# Patient Record
Sex: Male | Born: 1970
Health system: Southern US, Community
[De-identification: ages and names within clinical notes are randomized; demographics above are authoritative.]

## PROBLEM LIST (undated history)

## (undated) DIAGNOSIS — I82629 Acute embolism and thrombosis of deep veins of unspecified upper extremity: Secondary | ICD-10-CM

## (undated) HISTORY — PX: SURGERY SCROTAL / TESTICULAR: SUR1316

## (undated) HISTORY — DX: Acute embolism and thrombosis of deep veins of unspecified upper extremity: I82.629

## (undated) HISTORY — PX: KNEE SURGERY: SHX244

---

## 2010-01-04 ENCOUNTER — Ambulatory Visit: Payer: Self-pay | Admitting: Diagnostic Radiology

## 2010-01-04 ENCOUNTER — Encounter: Payer: Self-pay | Admitting: Internal Medicine

## 2010-01-04 ENCOUNTER — Emergency Department (HOSPITAL_BASED_OUTPATIENT_CLINIC_OR_DEPARTMENT_OTHER): Admission: EM | Admit: 2010-01-04 | Discharge: 2010-01-05 | Payer: Self-pay | Admitting: Emergency Medicine

## 2010-01-13 ENCOUNTER — Ambulatory Visit: Payer: Self-pay | Admitting: Internal Medicine

## 2010-01-13 DIAGNOSIS — R142 Eructation: Secondary | ICD-10-CM

## 2010-01-13 DIAGNOSIS — R141 Gas pain: Secondary | ICD-10-CM

## 2010-01-13 DIAGNOSIS — K602 Anal fissure, unspecified: Secondary | ICD-10-CM | POA: Insufficient documentation

## 2010-01-13 DIAGNOSIS — R143 Flatulence: Secondary | ICD-10-CM

## 2010-02-26 ENCOUNTER — Ambulatory Visit: Payer: Self-pay | Admitting: Internal Medicine

## 2010-04-13 NOTE — Assessment & Plan Note (Signed)
Summary: RECTAL PAIN AND BLEEDING- NOTES E CHART...AS.   History of Present Illness Visit Type: Initial Visit Primary GI MD: Stan Head MD [facility] Primary Tinsleigh Slovacek: n/a Requesting Lavona Norsworthy: N/A Chief Complaint: Pt thought he had hemorrhoids causing the rectal bleeding but was checked at [facility] and tested NEG for hemorrhoids History of Present Illness:   About 2 weeks ago he noted pain in the rectal area Seen in ED, negative rectal for hemorrhoids with normal CBC. Note removed. He thinks the pain began after defecation. He describes typical large bowel movenemnts. Intermittent red blood per rectum, bright. Streaks and then some more. Painful to defecate with sharp and cutting pain.Also with pruritis ani. Bowel movements are not hard. Did not get stool softeners as recommended and did not fill narcotic from ED.  Chronic blaoting and frequent gas. High fiber diet claimed. Rare heartburn.   GI Review of Systems    Reports acid reflux, bloating, chest pain, and  heartburn.      Denies abdominal pain, belching, dysphagia with liquids, dysphagia with solids, loss of appetite, nausea, vomiting, vomiting blood, weight loss, and  weight gain.      Reports rectal bleeding and  rectal pain.     Denies anal fissure, black tarry stools, change in bowel habit, constipation, diarrhea, diverticulosis, fecal incontinence, heme positive stool, hemorrhoids, irritable bowel syndrome, jaundice, light color stool, and  liver problems. Preventive Screening-Counseling & Management  Alcohol-Tobacco     Smoking Status: never  Caffeine-Diet-Exercise     Does Patient Exercise: yes      Drug Use:  no.      Current Medications (verified): 1)  None  Allergies (verified): No Known Drug Allergies  Past History:  Past Medical History: Asthma (Child)  Past Surgical History: Reviewed history from 01/12/2010 and no changes required. Knee Surgery  Family History: No FH of Colon  Cancer: Family History of Prostate Cancer: uncle  Social History: Patient has never smoked.  Alcohol Use - yes  occasional No Caffeine Illicit Drug Use - no Patient gets regular exercise. Smoking Status:  never Drug Use:  no Does Patient Exercise:  yes  Review of Systems       The patient complains of allergy/sinus and back pain.         All other ROS negative except as per HPI.   Vital Signs:  Patient profile:   40 year old male Height:      75 inches Weight:      259 pounds BMI:     32.49 BSA:     2.45 Pulse rate:   76 / minute Pulse rhythm:   regular BP sitting:   124 / 84  (left arm)  Vitals Entered By: Merri Ray CMA Duncan Dull) (January 13, 2010 8:53 AM)  Physical Exam  General:  Well developed, well nourished, no acute distress. Eyes:  PERRLA, no icterus. Mouth:  No deformity or lesions, dentition normal. Neck:  Supple; no masses or thyromegaly. Lungs:  Clear throughout to auscultation. Heart:  Regular rate and rhythm; no murmurs, rubs,  or bruits. Abdomen:  Soft, nontender and nondistended. No masses, hepatosplenomegaly or hernias noted. Normal bowel sounds. Rectal:  inspection - normal anoderm except for anterior sentinel pile tender with 5th digit partially inserted, especially anterior no masses/fluctuance Extremities:  No clubbing, cyanosis, edema or deformities noted. Neurologic:  Alert and  oriented x4;  Cervical Nodes:  No significant cervical or supraclavicular adenopathy.  Psych:  Alert and cooperative. Normal mood  and affect.  Procedure: CBC WITH DIFF Result: WBC COUNT 5.0 K/uL [4.0-10.5] RBC COUNT 5.59 MIL/uL [4.22-5.81] HEMOGLOBIN 15.6 g/dL [04.5-40.9] HEMATOCRIT 47.4 % [39.0-52.0] MCV 84.7 fL [78.0-100.0] MCH 27.9 pg [26.0-34.0] MCHC 32.9 g/dL [81.1-91.4] RDW 78.2 % [11.5-15.5] PLATELET COUNT 209 K/uL [150-400] NEUTROPHIL 34 % [43-77] L ABS GRANULOCYTE 1.7 K/uL [1.7-7.7] LYMPHOCYTE 51 % [12-46] H ABS LYMPH 2.5 K/uL  [0.7-4.0] MONOCYTE 8 % [3-12] ABS MONOCYTE 0.4 K/uL [0.1-1.0] EOSINOPHIL 6 % [0-5] H ABS EOS 0.3 K/uL [0.0-0.7] BASOPHIL 1 % [0-1] ABS BASO 0.1 K/uL [0.0-0.1] 23:51 01/04/2010 by April Palumbo-Rasch - MD, MD  Impression & Recommendations:  Problem # 1:  ANAL FISSURE (ICD-565.0) Assessment [place] and exam are classic for this. Stool softeners, sitz baths, anal care and diltiazem gel ar ethe plan. reassess at 6 weeks. Could need a surgical referral if fails to resolve. I explained nature of the problem to patient and wife.  Problem # 2:  FLATULENCE ERUCTATION AND GAS PAIN (ICD-787.3) Assessment: [place] functional and diet related. Try Align probiotic, reduce fber. May need to reduce carbohydrates also.  Patient Instructions: 1)  Please pick up your medications at your pharmacy. Diltiazem gel was prescribed to treat anal fissure. 2)  Take a stool softener 1-2 times a day. 3)  Soak in a tub and use a hair dryer and wet wipes for anal care as discussed. 4)  If itching remains a problem find an ointment or cream with pramoxine in it (over the counter). 5)  Take 1 Align capsule daily for 1 month. If it helped you may take it 1 month at a time as needed in a pulse fashion, or you may find you need it every day, chronically. It is available over the counter. 6)  Please schedule a follow-up appointment in 6  weeks.  7)  Anal Fissure, Abcess and fistula brochure given. Gas reduction diet given. 8)  The medication list was reviewed and reconciled.  All changed / newly prescribed medications were explained.  A complete medication list was provided to the patient / caregiver. Prescriptions: DILTIAZEM 2% GEL apply small amount to anal area 2-3 times a day and after bowel movements.  #30 grams x 1   Entered and Authorized by:   Iva Boop MD, [facility]   Signed by:   Iva Boop MD, [facility] on 01/13/2010   Method used:   Printed then faxed to ...       [facility]* (retail)        [address]       [place], [place]  [id number]       Ph: [phone]       Fax: [phone]   RxID:   [id number]

## 2010-05-26 LAB — CBC
HCT: 47.4 % (ref 39.0–52.0)
RDW: 12.4 % (ref 11.5–15.5)
WBC: 5 10*3/uL (ref 4.0–10.5)

## 2010-05-26 LAB — DIFFERENTIAL
Basophils Absolute: 0.1 10*3/uL (ref 0.0–0.1)
Lymphocytes Relative: 51 % — ABNORMAL HIGH (ref 12–46)
Monocytes Absolute: 0.4 10*3/uL (ref 0.1–1.0)
Neutro Abs: 1.7 10*3/uL (ref 1.7–7.7)

## 2010-05-26 LAB — BASIC METABOLIC PANEL
BUN: 17 mg/dL (ref 6–23)
GFR calc Af Amer: >60 mL/min (ref >60)
GFR calc non Af Amer: 56 mL/min — ABNORMAL LOW (ref >60)
Potassium: 5 mEq/L (ref 3.5–5.1)
Sodium: 143 mEq/L (ref 135–145)

## 2010-05-26 LAB — HEMOCCULT GUIAC POC 1CARD (OFFICE): Fecal Occult Bld: NEGATIVE

## 2011-04-04 ENCOUNTER — Emergency Department (INDEPENDENT_AMBULATORY_CARE_PROVIDER_SITE_OTHER): Payer: Self-pay

## 2011-04-04 ENCOUNTER — Emergency Department (HOSPITAL_BASED_OUTPATIENT_CLINIC_OR_DEPARTMENT_OTHER)
Admission: EM | Admit: 2011-04-04 | Discharge: 2011-04-04 | Disposition: A | Discharge status: Home / Self Care | Payer: Self-pay | Attending: Emergency Medicine | Admitting: Emergency Medicine

## 2011-04-04 ENCOUNTER — Encounter (HOSPITAL_BASED_OUTPATIENT_CLINIC_OR_DEPARTMENT_OTHER): Payer: Self-pay

## 2011-04-04 DIAGNOSIS — Z79899 Other long term (current) drug therapy: Secondary | ICD-10-CM | POA: Insufficient documentation

## 2011-04-04 DIAGNOSIS — J45909 Unspecified asthma, uncomplicated: Secondary | ICD-10-CM | POA: Insufficient documentation

## 2011-04-04 DIAGNOSIS — R062 Wheezing: Secondary | ICD-10-CM

## 2011-04-04 DIAGNOSIS — R05 Cough: Secondary | ICD-10-CM

## 2011-04-04 DIAGNOSIS — R5383 Other fatigue: Secondary | ICD-10-CM

## 2011-04-04 DIAGNOSIS — J40 Bronchitis, not specified as acute or chronic: Secondary | ICD-10-CM | POA: Insufficient documentation

## 2011-04-04 MED ORDER — IPRATROPIUM BROMIDE 0.02 % IN SOLN
0.5000 mg | Freq: Once | RESPIRATORY_TRACT | Status: AC
  Administered 2011-04-04: 0.5 mg via RESPIRATORY_TRACT
  Filled 2011-04-04: qty 2.5

## 2011-04-04 MED ORDER — PREDNISONE 50 MG PO TABS
60.0000 mg | ORAL_TABLET | Freq: Once | ORAL | Status: AC
  Administered 2011-04-04: 60 mg via ORAL
  Filled 2011-04-04: qty 1

## 2011-04-04 MED ORDER — GUAIFENESIN-CODEINE 100-10 MG/5ML PO SYRP: 5.0000 mL | ORAL_SOLUTION | Freq: Three times a day (TID) | ORAL | Status: AC | PRN

## 2011-04-04 MED ORDER — DOXYCYCLINE HYCLATE 100 MG PO CAPS: 100.0000 mg | ORAL_CAPSULE | Freq: Two times a day (BID) | ORAL | Status: AC

## 2011-04-04 MED ORDER — ALBUTEROL SULFATE HFA 108 (90 BASE) MCG/ACT IN AERS: 1.0000 | INHALATION_SPRAY | Freq: Four times a day (QID) | RESPIRATORY_TRACT | Status: DC | PRN

## 2011-04-04 MED ORDER — PREDNISONE 50 MG PO TABS: ORAL_TABLET | ORAL | Status: AC

## 2011-04-04 MED ORDER — ALBUTEROL SULFATE (5 MG/ML) 0.5% IN NEBU
5.0000 mg | INHALATION_SOLUTION | Freq: Once | RESPIRATORY_TRACT | Status: AC
  Administered 2011-04-04: 5 mg via RESPIRATORY_TRACT
  Filled 2011-04-04: qty 1

## 2011-04-04 NOTE — ED Notes (Signed)
Heart rate 97 with ambulation

## 2011-04-04 NOTE — ED Notes (Signed)
Pt states that he was dx with flu about 2 weeks ago and symptoms went away but states that he still has a cough which will not ago away.  Wheezing persists.  Hx of asthma

## 2011-04-04 NOTE — ED Provider Notes (Signed)
History   This chart was scribed for Glynn Octave, MD by Melba Coon. The patient was seen in room MH03/MH03 and the patient's care was started at 8:45PM.    CSN: [id number]  Arrival date & time 04/04/11  2005   First MD Initiated Contact with Patient 04/04/11 2032      Chief Complaint  Patient presents with  . Cough    (Consider location/radiation/quality/duration/timing/severity/associated sxs/prior treatment) HPI Roy Hill is a 41 y.o. male who presents to the Emergency Department complaining of persistent, moderate to severe productive cough with an onset 2 weeks ago. Pt had flu-like symptoms 2 weeks ago (fever, nausea, cough etc.). Since then, the other symptoms have gone away but the cough continues to be present. Pt initially took Nyquil which did not alleviate his cough; laying down at night aggravates his cough. Pt went to [facility] last week for cough; pt was given steroid shot at hospital, sent home with cough meds and breathing treatment, and told to f/u w/ PCP after 4 days. Pt does not have a regular PCP. Cough started to get worse which is way he presented to the ED today. CP pain present when pt coughs (hurt in ribs), wheezing also present. No n/v/d/, rhinorrhea, sore throat, stomach pain, problems swallowing. No other medical problems. + Hx of asthma.  No hospitalizations since childhood.   Past Medical History  Diagnosis Date  . Asthma     Past Surgical History  Procedure Date  . Knee surgery   . Surgery scrotal / testicular     History reviewed. No pertinent family history.  History  Substance Use Topics  . Smoking status: Never Smoker   . Smokeless tobacco: Never Used  . Alcohol Use: Yes     occasionally      Review of Systems 10 Systems reviewed and are negative for acute change except as noted in the HPI.  Allergies  Review of patient's allergies indicates no known allergies.  Home Medications   Current Outpatient Rx    Name Route Sig Dispense Refill  . HYDROCODONE-HOMATROPINE 5-1.5 MG/5ML PO SYRP Oral Take 5 mLs by mouth every 4 (four) hours as needed. For cough    . ADULT MULTIVITAMIN W/MINERALS CH Oral Take 1 tablet by mouth daily.    Marland Kitchen PSEUDOEPH-DOXYLAMINE-DM-APAP 60-7.08-10-998 MG/30ML PO LIQD Oral Take 30 mLs by mouth at bedtime as needed. For cough and congestion    . ALBUTEROL SULFATE HFA 108 (90 BASE) MCG/ACT IN AERS Inhalation Inhale 1-2 puffs into the lungs every 6 (six) hours as needed for wheezing. 1 Inhaler 0  . DOXYCYCLINE HYCLATE 100 MG PO CAPS Oral Take 1 capsule (100 mg total) by mouth 2 (two) times daily. 20 capsule 0  . GUAIFENESIN-CODEINE 100-10 MG/5ML PO SYRP Oral Take 5 mLs by mouth 3 (three) times daily as needed for cough. 120 mL 0  . PREDNISONE 50 MG PO TABS  1 tablet PO daily 5 tablet 0    BP 153/112  Pulse 97  Temp(Src) 98.4 F (36.9 C) (Oral)  Resp 22  Ht 6\' 3"  (1.905 m)  Wt 260 lb (117.935 kg)  BMI 32.50 kg/m2  SpO2 97%  Physical Exam  Constitutional: He is oriented to person, place, and time. He appears well-developed and well-nourished.  HENT:  Head: Normocephalic and atraumatic.  Right Ear: External ear normal.  Left Ear: External ear normal.  Eyes: Conjunctivae and EOM are normal. Pupils are equal, round, and reactive to light. No scleral icterus.  Neck: Normal range of motion. Neck supple. No thyromegaly present.  Cardiovascular: Normal rate, regular rhythm and normal heart sounds.  Exam reveals no gallop and no friction rub.   No murmur heard. Pulmonary/Chest: Effort normal. No stridor. He has wheezes (Scattered). He has no rales. He exhibits no tenderness.       Coarse breath sounds  Abdominal: Soft. He exhibits no distension. There is no tenderness. There is no rebound.  Musculoskeletal: Normal range of motion. He exhibits no edema.  Lymphadenopathy:    He has no cervical adenopathy.  Neurological: He is alert and oriented to person, place, and time.  Coordination normal.  Skin: Skin is warm. No rash noted. No erythema.  Psychiatric: He has a normal mood and affect. His behavior is normal. Judgment and thought content normal.    ED Course  Procedures (including critical care time)  DIAGNOSTIC STUDIES: Oxygen Saturation is 98% on room air, normal by my interpretation.    COORDINATION OF CARE:  8:49PM - EDMD consults with pt about possible ways to alleviate cough symptoms (i.e. keep away from strong scents; don't be around sick ppl; don't be around smokers; humidifier at home, etc.)   9:26PM - plans for d/c; pt agrees   Labs Reviewed - No data to display Dg Chest 2 View  04/04/2011  *RADIOLOGY REPORT*  Clinical Data: Cough, wheezing and weakness.  CHEST - 2 VIEW  Comparison: Single view of the chest 01/04/2010.  Findings: Lung volumes are low with some mild basilar atelectasis. No consolidative process, pneumothorax or effusion.  Heart size normal.  IMPRESSION: No acute finding.  Original Report Authenticated By: Bernadene Bell. D'ALESSIO, M.D.     1. Bronchitis       MDM  History of asthma with cough and wheezing for the past 2 weeks. She was seen in the [facility] ER 4 days ago and given a cough medicine. No fevers, chest pain.  No desaturation with ambulation.  No distress. Treat for bronchitis with antibiotics and steroids.   I personally performed the services described in this documentation, which was scribed in my presence.  The recorded information has been reviewed and considered.       Glynn Octave, MD 04/05/11 [phone]

## 2011-04-04 NOTE — ED Notes (Signed)
Pt has an hx of asthma but has had the flu but still has the cough for the past 2 weeks. Pt was seen at [facility] and was given cough RX and an Albuterol MDI but symptoms are the same.

## 2011-11-23 ENCOUNTER — Encounter (HOSPITAL_BASED_OUTPATIENT_CLINIC_OR_DEPARTMENT_OTHER): Payer: Self-pay | Admitting: *Deleted

## 2011-11-23 ENCOUNTER — Emergency Department (HOSPITAL_BASED_OUTPATIENT_CLINIC_OR_DEPARTMENT_OTHER)
Admission: EM | Admit: 2011-11-23 | Discharge: 2011-11-23 | Disposition: A | Discharge status: Home / Self Care | Payer: BC Managed Care – PPO | Attending: Emergency Medicine | Admitting: Emergency Medicine

## 2011-11-23 DIAGNOSIS — K529 Noninfective gastroenteritis and colitis, unspecified: Secondary | ICD-10-CM

## 2011-11-23 DIAGNOSIS — R197 Diarrhea, unspecified: Secondary | ICD-10-CM | POA: Insufficient documentation

## 2011-11-23 DIAGNOSIS — R1110 Vomiting, unspecified: Secondary | ICD-10-CM | POA: Insufficient documentation

## 2011-11-23 DIAGNOSIS — J45909 Unspecified asthma, uncomplicated: Secondary | ICD-10-CM | POA: Insufficient documentation

## 2011-11-23 LAB — CBC WITH DIFFERENTIAL/PLATELET
Basophils Absolute: 0 10*3/uL (ref 0.0–0.1)
Basophils Relative: 1 % (ref 0–1)
Eosinophils Absolute: 0.2 10*3/uL (ref 0.0–0.7)
Eosinophils Relative: 5 % (ref 0–5)
HCT: 43.3 % (ref 39.0–52.0)
Hemoglobin: 14.6 g/dL (ref 13.0–17.0)
Lymphocytes Relative: 51 % — ABNORMAL HIGH (ref 12–46)
Lymphs Abs: 2.3 10*3/uL (ref 0.7–4.0)
MCH: 26.4 pg (ref 26.0–34.0)
MCHC: 33.7 g/dL (ref 30.0–36.0)
MCV: 78.4 fL (ref 78.0–100.0)
Monocytes Absolute: 0.5 10*3/uL (ref 0.1–1.0)
Monocytes Relative: 10 % (ref 3–12)
Neutro Abs: 1.5 10*3/uL — ABNORMAL LOW (ref 1.7–7.7)
Neutrophils Relative %: 33 % — ABNORMAL LOW (ref 43–77)
Platelets: 223 10*3/uL (ref 150–400)
RBC: 5.52 MIL/uL (ref 4.22–5.81)
RDW: 13.5 % (ref 11.5–15.5)
WBC: 4.5 10*3/uL (ref 4.0–10.5)

## 2011-11-23 LAB — URINALYSIS, ROUTINE W REFLEX MICROSCOPIC
Bilirubin Urine: NEGATIVE
Glucose, UA: NEGATIVE mg/dL
Hgb urine dipstick: NEGATIVE
Ketones, ur: NEGATIVE mg/dL
Leukocytes, UA: NEGATIVE
Nitrite: NEGATIVE
Protein, ur: NEGATIVE mg/dL
Specific Gravity, Urine: 1.016 (ref 1.005–1.030)
Urobilinogen, UA: 0.2 mg/dL (ref 0.0–1.0)
pH: 6 (ref 5.0–8.0)

## 2011-11-23 LAB — COMPREHENSIVE METABOLIC PANEL
ALT: 31 U/L (ref 0–53)
AST: 23 U/L (ref 0–37)
Albumin: 4 g/dL (ref 3.5–5.2)
Alkaline Phosphatase: 98 U/L (ref 39–117)
BUN: 14 mg/dL (ref 6–23)
CO2: 26 mEq/L (ref 19–32)
Calcium: 9.6 mg/dL (ref 8.4–10.5)
Chloride: 102 mEq/L (ref 96–112)
Creatinine, Ser: 1.2 mg/dL (ref 0.50–1.35)
GFR calc Af Amer: 85 mL/min — ABNORMAL LOW (ref >90)
GFR calc non Af Amer: 73 mL/min — ABNORMAL LOW (ref >90)
Glucose, Bld: 73 mg/dL (ref 70–99)
Potassium: 4.3 mEq/L (ref 3.5–5.1)
Sodium: 138 mEq/L (ref 135–145)
Total Bilirubin: 0.5 mg/dL (ref 0.3–1.2)
Total Protein: 7.6 g/dL (ref 6.0–8.3)

## 2011-11-23 MED ORDER — PROMETHAZINE HCL 25 MG PO TABS: 25.0000 mg | ORAL_TABLET | Freq: Four times a day (QID) | ORAL | Status: DC | PRN

## 2011-11-23 MED ORDER — SODIUM CHLORIDE 0.9 % IV BOLUS (SEPSIS)
2000.0000 mL | Freq: Once | INTRAVENOUS | Status: AC
  Administered 2011-11-23: 2000 mL via INTRAVENOUS

## 2011-11-23 MED ORDER — LOPERAMIDE HCL 2 MG PO CAPS: 2.0000 mg | ORAL_CAPSULE | Freq: Four times a day (QID) | ORAL | Status: AC | PRN

## 2011-11-23 MED ORDER — ONDANSETRON HCL 4 MG/2ML IJ SOLN
4.0000 mg | Freq: Once | INTRAMUSCULAR | Status: AC
  Administered 2011-11-23: 4 mg via INTRAVENOUS
  Filled 2011-11-23: qty 2

## 2011-11-23 NOTE — ED Provider Notes (Signed)
Medical screening examination/treatment/procedure(s) were performed by non-physician practitioner and as supervising physician I was immediately available for consultation/collaboration.   Joya Gaskins, MD 11/23/11 [phone]

## 2011-11-23 NOTE — ED Notes (Signed)
Pt amb to triage with quick steady gait in nad. Pt reports sudden onset of n/v/d and cramping x Monday. Pt states he is unable to retain any po. Denies any abd pain or fevers.

## 2011-11-23 NOTE — ED Provider Notes (Signed)
History     CSN: [id number]  Arrival date & time 11/23/11  1601   First MD Initiated Contact with Patient 11/23/11 1613      Chief Complaint  Patient presents with  . Emesis  . Diarrhea    (Consider location/radiation/quality/duration/timing/severity/associated sxs/prior treatment) HPI  The patient presents with nausea, vomiting, and diarrhea.  These symptoms began late Monday night/early Tuesday morning. The patient has only been able to consume sips of ginger ale and a few crackers. The patient denies fever, chills, headache, vision changes, nasal congestion, cough, sore throat, hematemesis, chest pain, SOB, abdominal pain, hematochezia, and urinary symptoms.  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  Past Medical History  Diagnosis Date  . Asthma     Past Surgical History  Procedure Date  . Knee surgery   . Surgery scrotal / testicular     History reviewed. No pertinent family history.  History  Substance Use Topics  . Smoking status: Never Smoker   . Smokeless tobacco: Never Used  . Alcohol Use: Yes     occasionally      Review of Systems All other systems negative except as documented in the HPI. All pertinent positives and negatives as reviewed in the HPI.  Allergies  Review of patient's allergies indicates no known allergies.  Home Medications   Current Outpatient Rx  Name Route Sig Dispense Refill  . ALBUTEROL SULFATE HFA 108 (90 BASE) MCG/ACT IN AERS Inhalation Inhale 1-2 puffs into the lungs every 6 (six) hours as needed for wheezing. 1 Inhaler 0  . HYDROCODONE-HOMATROPINE 5-1.5 MG/5ML PO SYRP Oral Take 5 mLs by mouth every 4 (four) hours as needed. For cough    . ADULT MULTIVITAMIN W/MINERALS CH Oral Take 1 tablet by mouth daily.    Marland Kitchen PSEUDOEPH-DOXYLAMINE-DM-APAP 60-7.08-10-998 MG/30ML PO LIQD Oral Take 30 mLs by mouth at bedtime as needed. For cough and congestion      BP 127/80  Pulse 70  Temp  98.3 F (36.8 C) (Oral)  Resp 18  Ht 6\' 3"  (1.905 m)  Wt 250 lb (113.399 kg)  BMI 31.25 kg/m2  SpO2 100%  Physical Exam  Constitutional: He is oriented to person, place, and time. He appears well-developed and well-nourished. No distress.  HENT:  Head: Normocephalic and atraumatic.  Right Ear: External ear normal.  Left Ear: External ear normal.  Nose: Nose normal.  Mouth/Throat: Oropharynx is clear and moist. No oropharyngeal exudate.  Eyes: Conjunctivae normal and EOM are normal. Pupils are equal, round, and reactive to light. Right eye exhibits no discharge. Left eye exhibits no discharge.  Neck: Normal range of motion. Neck supple. No thyromegaly present.  Cardiovascular: Normal rate, regular rhythm and normal heart sounds.   No murmur heard. Pulmonary/Chest: Breath sounds normal. No respiratory distress. He has no wheezes. He has no rales. He exhibits no tenderness.  Abdominal: Soft. Bowel sounds are normal. He exhibits no distension and no mass. There is no tenderness. There is no rigidity and no guarding.  Musculoskeletal: Normal range of motion. He exhibits no edema and no tenderness.  Lymphadenopathy:    He has no cervical adenopathy.  Neurological: He is alert and oriented to person, place, and time.  Skin: Skin is warm and dry. No rash noted. He is not diaphoretic. No erythema.  Psychiatric: He has a normal mood and affect. His behavior is normal. Judgment and thought content normal.    ED Course  Procedures (including  critical care time)   Labs Reviewed  CBC WITH DIFFERENTIAL  COMPREHENSIVE METABOLIC PANEL  URINALYSIS, ROUTINE W REFLEX MICROSCOPIC   No results found.   No diagnosis found.  Patient assessed and vitals stable. CBC, CMP, and UA to assess for infection and electrolyte abnormalities.  IV fluids given for hydration.  Zofran for symptomatic treatment.    6:00 pm - rechecked patient.  He just started his second liter of IV Fluids.   Feels the same in  terms of presenting symptoms except he has minor headache.  Has not had a bowel movement here, but needs to urinate so we will obtain urinalysis.  I reviewed CBC and CMP results with the patient.  He had no current questions or concerns.  7:01pm- patient is feeling better. The patient was able to tolerate ginger ale here. This is most likely gastroenteritis based on his HPI and PE,. Patient is advised to return here as needed. MDM  MDM Reviewed: nursing note and vitals Interpretation: labs           Carlyle Dolly, PA-C 11/23/11 1903  Carlyle Dolly, PA-C 11/23/11 1904

## 2012-03-30 ENCOUNTER — Encounter (HOSPITAL_BASED_OUTPATIENT_CLINIC_OR_DEPARTMENT_OTHER): Payer: Self-pay | Admitting: Emergency Medicine

## 2012-03-30 ENCOUNTER — Emergency Department (HOSPITAL_BASED_OUTPATIENT_CLINIC_OR_DEPARTMENT_OTHER)
Admission: EM | Admit: 2012-03-30 | Discharge: 2012-03-30 | Disposition: A | Discharge status: Home / Self Care | Payer: BC Managed Care – PPO | Attending: Emergency Medicine | Admitting: Emergency Medicine

## 2012-03-30 DIAGNOSIS — M79609 Pain in unspecified limb: Secondary | ICD-10-CM | POA: Insufficient documentation

## 2012-03-30 DIAGNOSIS — J45909 Unspecified asthma, uncomplicated: Secondary | ICD-10-CM | POA: Insufficient documentation

## 2012-03-30 DIAGNOSIS — M79673 Pain in unspecified foot: Secondary | ICD-10-CM

## 2012-03-30 DIAGNOSIS — Z87828 Personal history of other (healed) physical injury and trauma: Secondary | ICD-10-CM | POA: Insufficient documentation

## 2012-03-30 MED ORDER — HYDROCODONE-ACETAMINOPHEN 5-325 MG PO TABS: 2.0000 | ORAL_TABLET | ORAL | Status: DC | PRN
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
## 2012-03-30 NOTE — ED Notes (Signed)
Patient refused crutches, states he has them at home

## 2012-03-30 NOTE — ED Notes (Signed)
Patient reports that he injured his foot in October of 2013 and it is hurting worse today. Patient is ambulating WNL briskly in tennis shoes. Reports increased pain and swelling today.

## 2012-04-11 ENCOUNTER — Emergency Department (HOSPITAL_BASED_OUTPATIENT_CLINIC_OR_DEPARTMENT_OTHER)
Admission: EM | Admit: 2012-04-11 | Discharge: 2012-04-11 | Disposition: A | Discharge status: Home / Self Care | Payer: BC Managed Care – PPO | Attending: Emergency Medicine | Admitting: Emergency Medicine

## 2012-04-11 ENCOUNTER — Encounter (HOSPITAL_BASED_OUTPATIENT_CLINIC_OR_DEPARTMENT_OTHER): Payer: Self-pay | Admitting: *Deleted

## 2012-04-11 DIAGNOSIS — Z87828 Personal history of other (healed) physical injury and trauma: Secondary | ICD-10-CM | POA: Insufficient documentation

## 2012-04-11 DIAGNOSIS — M79673 Pain in unspecified foot: Secondary | ICD-10-CM

## 2012-04-11 DIAGNOSIS — Z79899 Other long term (current) drug therapy: Secondary | ICD-10-CM | POA: Insufficient documentation

## 2012-04-11 DIAGNOSIS — M79609 Pain in unspecified limb: Secondary | ICD-10-CM | POA: Insufficient documentation

## 2012-04-11 DIAGNOSIS — J45909 Unspecified asthma, uncomplicated: Secondary | ICD-10-CM | POA: Insufficient documentation

## 2012-04-11 MED ORDER — HYDROCODONE-ACETAMINOPHEN 5-325 MG PO TABS: 2.0000 | ORAL_TABLET | ORAL | Status: DC | PRN

## 2012-04-11 NOTE — ED Provider Notes (Signed)
History     CSN: [id number]  Arrival date & time 04/11/12  1929   First MD Initiated Contact with Patient 04/11/12 1949      Chief Complaint  Patient presents with  . Foot Pain    (Consider location/radiation/quality/duration/timing/severity/associated sxs/prior treatment) HPI Comments: Patient is a 42 year old male who presents with a 4 month history of left foot pain. Patient reports the pain started suddenly after an injury at work. The pain has been constant since the onset without relief. The pain does not radiate and is aching and severe. Weight bearing activity makes the pain worse. Vicodin makes the pain better. No associated symptoms. Patient reports having an Ortho follow up next week.    Past Medical History  Diagnosis Date  . Asthma     Past Surgical History  Procedure Date  . Knee surgery   . Surgery scrotal / testicular     History reviewed. No pertinent family history.  History  Substance Use Topics  . Smoking status: Never Smoker   . Smokeless tobacco: Never Used  . Alcohol Use: No     Comment: occasionally      Review of Systems  Musculoskeletal: Positive for arthralgias.  All other systems reviewed and are negative.    Allergies  Review of patient's allergies indicates no known allergies.  Home Medications   Current Outpatient Rx  Name  Route  Sig  Dispense  Refill  . HYDROCODONE-ACETAMINOPHEN 5-325 MG PO TABS   Oral   Take 2 tablets by mouth every 4 (four) hours as needed for pain.   10 tablet   0   . HYDROCODONE-HOMATROPINE 5-1.5 MG/5ML PO SYRP   Oral   Take 5 mLs by mouth every 4 (four) hours as needed. For cough         . ADULT MULTIVITAMIN W/MINERALS CH   Oral   Take 1 tablet by mouth daily.         Marland Kitchen PROMETHAZINE HCL 25 MG PO TABS   Oral   Take 1 tablet (25 mg total) by mouth every 6 (six) hours as needed for nausea.   10 tablet   0   . PSEUDOEPH-DOXYLAMINE-DM-APAP 60-7.08-10-998 MG/30ML PO LIQD   Oral   Take 30  mLs by mouth at bedtime as needed. For cough and congestion           BP 126/63  Pulse 67  Temp 98.1 F (36.7 C) (Oral)  Resp 16  Ht 6\' 3"  (1.905 m)  Wt 250 lb (113.399 kg)  BMI 31.25 kg/m2  SpO2 99%  Physical Exam  Nursing note and vitals reviewed. Constitutional: He is oriented to person, place, and time. He appears well-developed and well-nourished. No distress.  HENT:  Head: Normocephalic and atraumatic.  Eyes: Conjunctivae normal are normal.  Neck: Normal range of motion. Neck supple.  Cardiovascular: Normal rate, regular rhythm and intact distal pulses.  Exam reveals no gallop and no friction rub.   No murmur heard. Pulmonary/Chest: Effort normal and breath sounds normal. He has no wheezes. He has no rales. He exhibits no tenderness.  Abdominal: Soft. There is no tenderness.  Musculoskeletal: Normal range of motion.       Left foot tender to palpation on volar aspect of great toe joint.   Neurological: He is alert and oriented to person, place, and time. Coordination normal.       Speech is goal-oriented. Moves limbs without ataxia.   Skin: Skin is warm and dry.  Psychiatric: He has a normal mood and affect. His behavior is normal.    ED Course  Procedures (including critical care time)  Labs Reviewed - No data to display No results found.   1. Foot pain       MDM  8:07 PM Patient will have vicodin here for pain and be discharged with a prescription. No neurovascular compromise or new injury. Patient will follow up with Orthopedist for further evaluation.         Emilia Beck, PA-C 04/11/12 2129
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
## 2012-04-11 NOTE — ED Provider Notes (Signed)
Medical screening examination/treatment/procedure(s) were performed by non-physician practitioner and as supervising physician I was immediately available for consultation/collaboration.   Charles B. Sheldon, MD 04/11/12 2312 

## 2012-04-11 NOTE — Discharge Instructions (Signed)
Take Vicodin as needed for pain. Follow up with Orthopedist as discussed. Refer to attached documents for more information regarding your diagnosis. Return to the ED with worsening or concerning symptoms.

## 2012-04-17 ENCOUNTER — Encounter: Payer: Self-pay | Admitting: Family Medicine

## 2012-04-17 ENCOUNTER — Ambulatory Visit (INDEPENDENT_AMBULATORY_CARE_PROVIDER_SITE_OTHER): Payer: Worker's Compensation | Admitting: Family Medicine

## 2012-04-17 VITALS — BP 116/73 | HR 71 | Ht 75.0 in | Wt 250.0 lb

## 2012-04-17 DIAGNOSIS — S99921A Unspecified injury of right foot, initial encounter: Secondary | ICD-10-CM

## 2012-04-17 DIAGNOSIS — S8990XA Unspecified injury of unspecified lower leg, initial encounter: Secondary | ICD-10-CM

## 2012-04-17 DIAGNOSIS — S99929A Unspecified injury of unspecified foot, initial encounter: Secondary | ICD-10-CM

## 2012-04-17 MED ORDER — OXYCODONE-ACETAMINOPHEN 5-325 MG PO TABS: 1.0000 | ORAL_TABLET | Freq: Four times a day (QID) | ORAL | Status: DC | PRN

## 2012-04-17 NOTE — Patient Instructions (Addendum)
We will obtain records and imaging reports for what has been done to date so we can come up with a concrete plan. Take percocet as needed for severe pain (no working or driving on this). Aleve 2 tabs twice a day with food OR ibuprofen 3 tabs three times a day with food for pain and inflammation. Wear the inserts with metatarsal pads any time you're up and walking around. We will call you with next steps.

## 2012-04-18 ENCOUNTER — Encounter: Payer: Self-pay | Admitting: Family Medicine

## 2012-04-18 DIAGNOSIS — S99921A Unspecified injury of right foot, initial encounter: Secondary | ICD-10-CM | POA: Insufficient documentation

## 2012-04-18 NOTE — Progress Notes (Addendum)
Subjective:    Patient ID: Roy Hill, male    DOB: 1970/11/01, 42 y.o.   MRN: [id number]  PCP: None  HPI 42 yo M here for right foot injury.  Patient reports on 12/27/2011 while at work he injured his right foot. 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 Had injured his head, calf, and right foot - improved except for his right foot. Has plantar swelling, pain especially at base of 2nd toe. Seen at [facility] where per his report x-rays and MRI were read as normal. On further inspection physician said he may have a torn ligament in foot. Physical therapy, icing, meloxicam all done as well as oral steroids, cortisone injection into foot, and custom orthotics. Difficulty walking and doing his job though he's pushing through the pain to do so. Worse at end of day. Feels like he is stepping on a marble. Does not have x-ray, MRI, records with him today.  History reviewed. No pertinent past medical history.  Current Outpatient Prescriptions on File Prior to Visit  Medication Sig Dispense Refill  . Multiple Vitamin (MULITIVITAMIN WITH MINERALS) TABS Take 1 tablet by mouth daily.        Past Surgical History  Procedure Date  . Surgery scrotal / testicular   . Knee surgery     meniscal debridement    No Known Allergies  History   Social History  . Marital Status: Married    Spouse Name: N/A    Number of Children: N/A  . Years of Education: N/A   Occupational History  . Not on file.   Social History Main Topics  . Smoking status: Never Smoker   . Smokeless tobacco: Never Used  . Alcohol Use: No     Comment: occasionally  . Drug Use: No  . Sexually Active: Not on file   Other Topics Concern  . Not on file   Social History Narrative  . No narrative on file    Family History  Problem Relation Age of Onset   . Heart attack Father   . Diabetes Neg Hx   . Hyperlipidemia Neg Hx   . Hypertension Neg Hx   . Sudden death Neg Hx     BP 116/73  Pulse 71  Ht 6\' 3"  (1.905 m)  Wt 250 lb (113.399 kg)  BMI 31.25 kg/m2  Review of Systems See HPI above.    Objective:   Physical Exam Gen: NAD  R foot/ankle: Mild swelling plantar foot under 2nd MT head.  Collapsed transverse arch but no callus.  No hallux rigidus or valgus. FROM of ankle, digits without pain. TTP distal 2nd metatarsal reproducing his pain.  Less TTP 3rd MT head.  No other TTP foot, ankle. Negative ant drawer and talar tilt.   Negative syndesmotic compression. Thompsons test negative. Positive metatarsal squeeze. NV intact distally.  MSK u/s:  No evidence cortical irregularities, edema overlying cortex, neovascularity right foot 2nd, 3rd metatarsals.    Assessment & Plan:  1. Right foot injury - Radiographs dropped off and these show no evidence of bony abnormalities (from 01/09/12).  Advised we go ahead with obtaining MRI and report, prior records before moving forward with additional imaging, definitive treatment.  Exam suggests at least metatarsalgia though this would be a severe case - metatarsal pads added to his  inserts today and felt more comfortable with this.    Addendum 2/7:  Patient's records obtained including MRI report, x-rays.  Will scan these into the chart.  MRI shows a small interstitial tear in 2nd flexor digitorum tendon with tendinosis.  Additionally has partial tear of intersesamoid ligament, 1st MT head and sesamoid bruising, swelling and edema under 2nd and 3rd MTP joints, collateral ligament sprains and minimal intermetatarsal bursitis.  Of all these findings, the flexor digitorum tendinosis and partial tear is greatest cause of his pain.  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  Follow-up in 6 weeks for reevaluation.

## 2012-04-18 NOTE — Assessment & Plan Note (Signed)
Radiographs dropped off and these show no evidence of bony abnormalities (from 01/09/12).  Advised we go ahead with obtaining MRI and report, prior records before moving forward with additional imaging, definitive treatment.  Exam suggests at least metatarsalgia though this would be a severe case - metatarsal pads added to his inserts today and felt more comfortable with this.

## 2012-04-20 ENCOUNTER — Encounter: Payer: Self-pay | Admitting: Family Medicine

## 2012-04-20 MED ORDER — NITROGLYCERIN 0.2 MG/HR TD PT24: MEDICATED_PATCH | TRANSDERMAL | Status: DC

## 2012-04-20 NOTE — Addendum Note (Signed)
Addended by: Lenda Kelp on: 04/20/2012 10:13 AM   Modules accepted: Orders

## 2012-05-02 ENCOUNTER — Encounter: Payer: Self-pay | Admitting: Family Medicine

## 2012-05-02 ENCOUNTER — Ambulatory Visit (INDEPENDENT_AMBULATORY_CARE_PROVIDER_SITE_OTHER): Payer: Worker's Compensation | Admitting: Family Medicine

## 2012-05-02 VITALS — BP 112/78 | HR 82 | Ht 75.0 in | Wt 250.0 lb

## 2012-05-02 DIAGNOSIS — S99929A Unspecified injury of unspecified foot, initial encounter: Secondary | ICD-10-CM

## 2012-05-02 DIAGNOSIS — S99921A Unspecified injury of right foot, initial encounter: Secondary | ICD-10-CM

## 2012-05-02 NOTE — Patient Instructions (Addendum)
You have plantar fasciitis Take tylenol or aleve as needed for pain  Plantar fascia stretch for 20-30 seconds (do 3 of these) in morning Lowering/raise on a step exercises 3 x 10 once or twice a day - this is very important for long term recovery. Ice painful areas for 15 minutes as needed. Avoid flat shoes/barefoot walking as much as possible. Arch straps have been shown to help with pain - use these if they provide you with relief. Heel lifts in addition to your inserts may help with pain by avoiding fully stretching the plantar fascia except when doing home exercises. Physical therapy is also an option. Follow up with me in 4 weeks as we previously discussed.

## 2012-05-03 ENCOUNTER — Encounter: Payer: Self-pay | Admitting: Family Medicine

## 2012-05-03 NOTE — Assessment & Plan Note (Signed)
2nd flexor digitorum tendon partial tear, intersesamoid ligament partial tear, bruising, collateral ligament sprains and minimal intermetatarsal bursitis.  Current complaints and exam consistent with developing plantar fasciitis.  Arch binders provided.  Continue with arch supports.  Exercises and stretches demonstrated to do daily.  Continue nitro patches.  F/u in 4 weeks.

## 2012-05-03 NOTE — Progress Notes (Signed)
Subjective:    Patient ID: Roy Hill, male    DOB: 03-Jan-1971, 42 y.o.   MRN: [id number]  PCP: None  HPI  42 yo M here for f/u right foot injury.  2/4: Patient reports on 12/27/2011 while at work he injured his right foot. 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 Had injured his head, calf, and right foot - improved except for his right foot. Has plantar swelling, pain especially at base of 2nd toe. Seen at [facility] where per his report x-rays and MRI were read as normal. On further inspection physician said he may have a torn ligament in foot. Physical therapy, icing, meloxicam all done as well as oral steroids, cortisone injection into foot, and custom orthotics. Difficulty walking and doing his job though he's pushing through the pain to do so. Worse at end of day. Feels like he is stepping on a marble. Does not have x-ray, MRI, records with him today.  2/19: Patient has been out of work as nothing available with written restrictions. Has been using nitro patches, inserts with MT pads - noticed improvement with these so far. States occasionally getting sharp pains in arch of foot that shoot up to posterior calf. Happens 2-4 times a day. No other changes. No new injuries.  History reviewed. No pertinent past medical history.  Current Outpatient Prescriptions on File Prior to Visit  Medication Sig Dispense Refill  . Multiple Vitamin (MULITIVITAMIN WITH MINERALS) TABS Take 1 tablet by mouth daily.      . nitroGLYCERIN (NITRODUR - DOSED IN MG/24 HR) 0.2 mg/hr 1/4th patch over affected toe - change daily  30 patch  1  . oxyCODONE-acetaminophen (PERCOCET/ROXICET) 5-325 MG per tablet Take 1 tablet by mouth every 6 (six) hours as needed for pain.  60 tablet  0   No current facility-administered medications on file  prior to visit.    Past Surgical History  Procedure Laterality Date  . Surgery scrotal / testicular    . Knee surgery      meniscal debridement    No Known Allergies  History   Social History  . Marital Status: Married    Spouse Name: N/A    Number of Children: N/A  . Years of Education: N/A   Occupational History  . Not on file.   Social History Main Topics  . Smoking status: Never Smoker   . Smokeless tobacco: Never Used  . Alcohol Use: No     Comment: occasionally  . Drug Use: No  . Sexually Active: Not on file   Other Topics Concern  . Not on file   Social History Narrative  . No narrative on file    Family History  Problem Relation Age of Onset  . Heart attack Father   . Diabetes Neg Hx   . Hyperlipidemia Neg Hx   . Hypertension Neg Hx   . Sudden death Neg Hx     BP 112/78  Pulse 82  Ht 6\' 3"  (1.905 m)  Wt 250 lb (113.399 kg)  BMI 31.25 kg/m2  Review of Systems  See HPI above.    Objective:   Physical Exam  Gen: NAD  R foot/ankle: Mild swelling plantar foot under 2nd MT head.  Collapsed transverse arch but no callus.  No hallux rigidus or valgus. FROM  of ankle, digits without pain. TTP distal 2nd metatarsal reproducing his pain.  Also with TTP within body of plantar fascia.  No achilles TTP.  No other TTP foot, ankle. Negative ant drawer and talar tilt.   Negative syndesmotic compression. Thompsons test negative. NV intact distally.     Assessment & Plan:  1. Right foot injury - 2nd flexor digitorum tendon partial tear, intersesamoid ligament partial tear, bruising, collateral ligament sprains and minimal intermetatarsal bursitis.  Current complaints and exam consistent with developing plantar fasciitis.  Arch binders provided.  Continue with arch supports.  Exercises and stretches demonstrated to do daily.  Continue nitro patches.  F/u in 4 weeks.

## 2012-05-30 ENCOUNTER — Ambulatory Visit (INDEPENDENT_AMBULATORY_CARE_PROVIDER_SITE_OTHER): Payer: Worker's Compensation | Admitting: Family Medicine

## 2012-05-30 ENCOUNTER — Encounter: Payer: Self-pay | Admitting: Family Medicine

## 2012-05-30 VITALS — BP 126/84 | HR 79 | Ht 75.0 in | Wt 250.0 lb

## 2012-05-30 DIAGNOSIS — S99921D Unspecified injury of right foot, subsequent encounter: Secondary | ICD-10-CM

## 2012-05-30 MED ORDER — NITROGLYCERIN 0.2 MG/HR TD PT24: MEDICATED_PATCH | TRANSDERMAL | Status: DC

## 2012-05-30 NOTE — Patient Instructions (Addendum)
Keep your appointment with Dr. Victorino Dike on Monday - I would recommend he take over your case at this point. Continue with arch supports with metatarsal pads. Continue nitro patches (1/4th of a patch though). Keep doing home exercises as directed for your plantar fascia. Call me with any questions or if you need anything.

## 2012-05-31 ENCOUNTER — Encounter: Payer: Self-pay | Admitting: Family Medicine

## 2012-05-31 NOTE — Progress Notes (Addendum)
Subjective:    Patient ID: Roy Hill, male    DOB: June 10, 1970, 42 y.o.   MRN: [id number]  PCP: None  HPI  42 yo M here for f/u right foot injury.  2/4: Patient reports on 12/27/2011 while at work he injured his right foot. 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 Had injured his head, calf, and right foot - improved except for his right foot. Has plantar swelling, pain especially at base of 2nd toe. Seen at [facility] where per his report x-rays and MRI were read as normal. On further inspection physician said he may have a torn ligament in foot. Physical therapy, icing, meloxicam all done as well as oral steroids, cortisone injection into foot, and custom orthotics. Difficulty walking and doing his job though he's pushing through the pain to do so. Worse at end of day. Feels like he is stepping on a marble. Does not have x-ray, MRI, records with him today.  2/19: Patient has been out of work as nothing available with written restrictions. Has been using nitro patches, inserts with MT pads - noticed improvement with these so far. States occasionally getting sharp pains in arch of foot that shoot up to posterior calf. Happens 2-4 times a day. No other changes. No new injuries.  3/19: Patient returns with mild improvement but still a lot of difficulty with his right foot. Using nitro patches regularly. Pain primarily plantar thru dorsal 2nd metatarsal head. Doing home exercise program for plantar fasciitis. Was sent to Dr. Victorino Dike for another opinion and has an appointment with him on Monday. Continues to use metatarsal pads in orthotics.  History reviewed. No pertinent past medical history.  Current Outpatient Prescriptions on File Prior to Visit  Medication Sig Dispense Refill  . Multiple Vitamin (MULITIVITAMIN  WITH MINERALS) TABS Take 1 tablet by mouth daily.      Marland Kitchen oxyCODONE-acetaminophen (PERCOCET/ROXICET) 5-325 MG per tablet Take 1 tablet by mouth every 6 (six) hours as needed for pain.  60 tablet  0   No current facility-administered medications on file prior to visit.    Past Surgical History  Procedure Laterality Date  . Surgery scrotal / testicular    . Knee surgery      meniscal debridement    No Known Allergies  History   Social History  . Marital Status: Married    Spouse Name: N/A    Number of Children: N/A  . Years of Education: N/A   Occupational History  . Not on file.   Social History Main Topics  . Smoking status: Never Smoker   . Smokeless tobacco: Never Used  . Alcohol Use: No     Comment: occasionally  . Drug Use: No  . Sexually Active: Not on file   Other Topics Concern  . Not on file   Social History Narrative  . No narrative on file    Family History  Problem Relation Age of Onset  . Heart attack Father   . Diabetes Neg Hx   . Hyperlipidemia Neg Hx   . Hypertension Neg Hx   . Sudden death Neg Hx     BP 126/84  Pulse 79  Ht 6\' 3"  (1.905 m)  Wt 250 lb (113.399 kg)  BMI 31.25 kg/m2  Review of Systems  See HPI above.  Objective:   Physical Exam  Gen: NAD  R foot/ankle: Mild swelling plantar foot under 2nd MT head.  Collapsed transverse arch but no callus.  No hallux rigidus or valgus. FROM of ankle, digits without pain. TTP distal 2nd metatarsal reproducing his pain.  Also with mild TTP within body of plantar fascia - improved.  No achilles TTP.  No other TTP foot, ankle. Negative ant drawer and talar tilt.   Negative syndesmotic compression. Thompsons test negative. NV intact distally.     Assessment & Plan:  1. Right foot injury - 2nd flexor digitorum tendon partial tear, intersesamoid ligament partial tear, bruising, collateral ligament sprains and minimal intermetatarsal bursitis.  Also with some mild plantar fasciitis.   Continue with nitro patches, arch supports with metatarsal pads, home exercises for plantar fasciitis.  Keep appointment with Dr. Victorino Dike on Monday.  I would favor repeating his MRI given length of symptoms, known pathology here, to assess for healing of injuries above that were caused by his accident.  Will discharge him from my care and advised to keep regular follow-up with Dr. Victorino Dike for further recommendations.  Continue current restrictions at work (no light duty available per employer).  Call me with any questions.  Addendum 6/16:  Received a refill request for nitro patches.  He was prescribed these on 3/19 with 1 refill.  If he is cutting these into 1/4ths he should have 8 months worth.  Will decline.  Plan was also for him to just keep regular follow-up with Dr. Victorino Dike going forward.  Care had been transferred to him.

## 2012-05-31 NOTE — Assessment & Plan Note (Signed)
2nd flexor digitorum tendon partial tear, intersesamoid ligament partial tear, bruising, collateral ligament sprains and minimal intermetatarsal bursitis.  Also with some mild plantar fasciitis.  Continue with nitro patches, arch supports with metatarsal pads, home exercises for plantar fasciitis.  Keep appointment with Dr. Victorino Dike on Monday.  I would favor repeating his MRI given length of symptoms, known pathology here, to assess for healing of injuries above that were caused by his accident.  Will discharge him from my care and advised to keep regular follow-up with Dr. Victorino Dike for further recommendations.  Continue current restrictions at work (no light duty available per employer).  Call me with any questions.

## 2012-08-23 ENCOUNTER — Encounter (HOSPITAL_BASED_OUTPATIENT_CLINIC_OR_DEPARTMENT_OTHER): Payer: Self-pay | Admitting: *Deleted

## 2012-08-23 ENCOUNTER — Emergency Department (HOSPITAL_BASED_OUTPATIENT_CLINIC_OR_DEPARTMENT_OTHER)
Admission: EM | Admit: 2012-08-23 | Discharge: 2012-08-23 | Disposition: A | Discharge status: Home / Self Care | Payer: BC Managed Care – PPO | Attending: Emergency Medicine | Admitting: Emergency Medicine

## 2012-08-23 DIAGNOSIS — Z79899 Other long term (current) drug therapy: Secondary | ICD-10-CM | POA: Insufficient documentation

## 2012-08-23 DIAGNOSIS — L089 Local infection of the skin and subcutaneous tissue, unspecified: Secondary | ICD-10-CM | POA: Insufficient documentation

## 2012-08-23 MED ORDER — CLINDAMYCIN HCL 150 MG PO CAPS: 150.0000 mg | ORAL_CAPSULE | Freq: Four times a day (QID) | ORAL | Status: DC

## 2012-08-23 MED ORDER — OXYCODONE-ACETAMINOPHEN 5-325 MG PO TABS: 1.0000 | ORAL_TABLET | ORAL | Status: DC | PRN

## 2012-08-23 NOTE — ED Notes (Signed)
Left lower leg has area pt thinks may be a bug bite started itching on Monday night and didn't  Look at it then noticed it Tuesday was becoming more irritated and swollen has been applying OTC creams to area not changing

## 2012-08-23 NOTE — ED Provider Notes (Signed)
History     CSN: [id number]  Arrival date & time 08/23/12  1100   First MD Initiated Contact with Patient 08/23/12 1121      Chief Complaint  Patient presents with  . possible bug bite infection      The history is provided by the patient.  Skin lesion Onset - 2 days ago Course - worsening Improved by - nothing Worsened by - palpation  Pt presents for possible bug bite to left LE.  He reports mowing grass two days ago and soon after noticed raised/reddened area to left LE.  No trauma or falls.  He reports taking benadryl without relief.  He reports more pain that actually pruritis      Past Surgical History  Procedure Laterality Date  . Surgery scrotal / testicular    . Knee surgery      meniscal debridement    Family History  Problem Relation Age of Onset  . Heart attack Father   . Diabetes Neg Hx   . Hyperlipidemia Neg Hx   . Hypertension Neg Hx   . Sudden death Neg Hx     History  Substance Use Topics  . Smoking status: Never Smoker   . Smokeless tobacco: Never Used  . Alcohol Use: No     Comment: occasionally      Review of Systems  Constitutional: Negative for fever.  Gastrointestinal: Negative for vomiting.  Skin: Positive for wound.    Allergies  Review of patient's allergies indicates no known allergies.  Home Medications   Current Outpatient Rx  Name  Route  Sig  Dispense  Refill  . clindamycin (CLEOCIN) 150 MG capsule   Oral   Take 1 capsule (150 mg total) by mouth every 6 (six) hours.   28 capsule   0   . Multiple Vitamin (MULITIVITAMIN WITH MINERALS) TABS   Oral   Take 1 tablet by mouth daily.         . nitroGLYCERIN (NITRODUR - DOSED IN MG/24 HR) 0.2 mg/hr      1/4th patch over affected toe - change daily   30 patch   1     Please cut into 1/4ths for patient if possible.   Marland Kitchen oxyCODONE-acetaminophen (PERCOCET/ROXICET) 5-325 MG per tablet   Oral   Take 1 tablet by mouth every 4 (four) hours as needed for pain.   5  tablet   0     BP 129/84  Pulse 72  Temp(Src) 97.9 F (36.6 C) (Oral)  Resp 20  Ht 6\' 3"  (1.905 m)  Wt 250 lb (113.399 kg)  BMI 31.25 kg/m2  SpO2 99%  Physical Exam CONSTITUTIONAL: Well developed/well nourished HEAD: Normocephalic/atraumatic EYES: EOMI ENMT: Mucous membranes moist, no facial swelling noted NECK: supple no meningeal signs CV: S1/S2 noted, no murmurs/rubs/gallops noted LUNGS: Lungs are clear to auscultation bilaterally, no apparent distress ABDOMEN: soft, nontender, no rebound or guarding NEURO: Pt is awake/alert, moves all extremitiesx4 EXTREMITIES: pulses normal, full ROM Left LE - he has small raised area on distal portion of tibial surface without crepitance/drainage/erythematous streaking.  It is tender to palpation.  No urticaria No abscess/fluctuance noted No LE edema.  No calf tenderness SKIN: warm, color normal PSYCH: no abnormalities of mood noted  ED Course  Procedures   1. Skin infection       MDM  Nursing notes including past medical history and social history reviewed and considered in documentation  Pt thinks he may have "bug bite" Denies tick  bite No convincing signs of allergic rxn.  He is already on clinda for dental infection I told him to finish this med.  If not improving he was given another Rx for clinda.  I discussed strict return precautions        Joya Gaskins, MD 08/23/12 1253

## 2014-07-06 ENCOUNTER — Encounter (HOSPITAL_BASED_OUTPATIENT_CLINIC_OR_DEPARTMENT_OTHER): Payer: Self-pay | Admitting: *Deleted

## 2014-07-06 ENCOUNTER — Emergency Department (HOSPITAL_BASED_OUTPATIENT_CLINIC_OR_DEPARTMENT_OTHER)
Admission: EM | Admit: 2014-07-06 | Discharge: 2014-07-06 | Disposition: A | Discharge status: Home / Self Care | Payer: BLUE CROSS/BLUE SHIELD | Attending: Emergency Medicine | Admitting: Emergency Medicine

## 2014-07-06 DIAGNOSIS — B029 Zoster without complications: Secondary | ICD-10-CM

## 2014-07-06 DIAGNOSIS — Z79899 Other long term (current) drug therapy: Secondary | ICD-10-CM | POA: Insufficient documentation

## 2014-07-06 MED ORDER — ACYCLOVIR 400 MG PO TABS: 800.0000 mg | ORAL_TABLET | Freq: Every day | ORAL | Status: DC

## 2014-07-06 NOTE — Discharge Instructions (Signed)
Please call your doctor for a followup appointment within 24-48 hours. When you talk to your doctor please let them know that you were seen in the emergency department and have them acquire all of your records so that they can discuss the findings with you and formulate a treatment plan to fully care for your new and ongoing problems. Please [facility] with [facility] Please report back to emergency department with an approximately 48 hours for site to be reassessed Please rest and stay hydrated Please take antivirals as prescribed Keep site covered Please avoid infants, young children, individuals were not vaccinated for varicella, elder individuals, pregnant women Please continue to monitor symptoms closely and if symptoms are to worsen or change (fever greater than 101, chills, sweating, nausea, vomiting, chest pain, shortness of breathe, difficulty breathing, weakness, numbness, tingling, worsening or changes to pain pattern, red streaks, worsening or widespread of the rash, neck pain, neck stiffness, blurred vision, sudden loss of vision, loss of sensation, weakness to the arms, swelling) please report back to the Emergency Department immediately.    Shingles Shingles (herpes zoster) is an infection that is caused by the same virus that causes chickenpox (varicella). The infection causes a painful skin rash and fluid-filled blisters, which eventually break open, crust over, and heal. It may occur in any area of the body, but it usually affects only one side of the body or face. The pain of shingles usually lasts about 1 month. However, some people with shingles may develop long-term (chronic) pain in the affected area of the body. Shingles often occurs many years after the person had chickenpox. It is more common:  In people older than 50 years.  In people with weakened immune systems, such as those with HIV, AIDS, or cancer.  In people taking medicines that weaken the immune  system, such as transplant medicines.  In people under great stress. CAUSES  Shingles is caused by the varicella zoster virus (VZV), which also causes chickenpox. After a person is infected with the virus, it can remain in the person's body for years in an inactive state (dormant). To cause shingles, the virus reactivates and breaks out as an infection in a nerve root. The virus can be spread from person to person (contagious) through contact with open blisters of the shingles rash. It will only spread to people who have not had chickenpox. When these people are exposed to the virus, they may develop chickenpox. They will not develop shingles. Once the blisters scab over, the person is no longer contagious and cannot spread the virus to others. SIGNS AND SYMPTOMS  Shingles shows up in stages. The initial symptoms may be pain, itching, and tingling in an area of the skin. This pain is usually described as burning, stabbing, or throbbing.In a few days or weeks, a painful red rash will appear in the area where the pain, itching, and tingling were felt. The rash is usually on one side of the body in a band or belt-like pattern. Then, the rash usually turns into fluid-filled blisters. They will scab over and dry up in approximately 2-3 weeks. Flu-like symptoms may also occur with the initial symptoms, the rash, or the blisters. These may include:  Fever.  Chills.  Headache.  Upset stomach. DIAGNOSIS  Your health care provider will perform a skin exam to diagnose shingles. Skin scrapings or fluid samples may also be taken from the blisters. This sample will be examined under a microscope or sent to a lab  for further testing. TREATMENT  There is no specific cure for shingles. Your health care provider will likely prescribe medicines to help you manage the pain, recover faster, and avoid long-term problems. This may include antiviral drugs, anti-inflammatory drugs, and pain medicines. HOME CARE  INSTRUCTIONS   Take a cool bath or apply cool compresses to the area of the rash or blisters as directed. This may help with the pain and itching.   Take medicines only as directed by your health care provider.   Rest as directed by your health care provider.  Keep your rash and blisters clean with mild soap and cool water or as directed by your health care provider.  Do not pick your blisters or scratch your rash. Apply an anti-itch cream or numbing creams to the affected area as directed by your health care provider.  Keep your shingles rash covered with a loose bandage (dressing).  Avoid skin contact with:  Babies.   Pregnant women.   Children with eczema.   Elderly people with transplants.   People with chronic illnesses, such as leukemia or AIDS.   Wear loose-fitting clothing to help ease the pain of material rubbing against the rash.  Keep all follow-up visits as directed by your health care provider.If the area involved is on your face, you may receive a referral for a specialist, such as an eye doctor (ophthalmologist) or an ear, nose, and throat (ENT) doctor. Keeping all follow-up visits will help you avoid eye problems, chronic pain, or disability.  SEEK IMMEDIATE MEDICAL CARE IF:   You have facial pain, pain around the eye area, or loss of feeling on one side of your face.  You have ear pain or ringing in your ear.  You have loss of taste.  Your pain is not relieved with prescribed medicines.   Your redness or swelling spreads.   You have more pain and swelling.  Your condition is worsening or has changed.   You have a fever. MAKE SURE YOU:  Understand these instructions.  Will watch your condition.  Will get help right away if you are not doing well or get worse. Document Released: 02/28/2005 Document Revised: 07/15/2013 Document Reviewed: 10/13/2011 [facility] Patient Information 2015 [employer], [place]. This information is not intended to  replace advice given to you by your health care provider. Make sure you discuss any questions you have with your health care provider.

## 2014-07-06 NOTE — ED Provider Notes (Signed)
CSN: [id number]     Arrival date & time 07/06/14  1513 History   First MD Initiated Contact with Patient 07/06/14 1729     Chief Complaint  Patient presents with  . Pain     (Consider location/radiation/quality/duration/timing/severity/associated sxs/prior Treatment) The history is provided by the patient. No language interpreter was used.  Roy Hill is a 44 year old male with no known significant past medical history who presents to the emergency department with a rash that started on the right side of his body. Patient reported that he noticed the discomfort approximately 4 days ago. Reported that he was experiencing a holding, soreness, numbness sensation in his right breast and some burning sensation in his right arm. Reported that yesterday he noticed a lesion just above the right nipple and on the inner aspect of his right arm-stated that he thought this was an insect bite secondary to bleeding 4 days ago. States that it is a burning sensation. Reported that he has not used anything for the discomfort. Denied fever, chills, neck pain, neck stiffness, blurred vision, sudden loss of vision, headache, chest pain, shortness of breath, difficulty breathing, nausea, vomiting, fainting, swelling to the arm, drainage or bleeding, pus drainage, tongue swelling, throat closing sensation. Denied changes to soap/shampoo/conditioner/lotion/cologne/cleaning products/detergents/fibric softener. PCP none  History reviewed. No pertinent past medical history. Past Surgical History  Procedure Laterality Date  . Surgery scrotal / testicular    . Knee surgery      meniscal debridement   Family History  Problem Relation Age of Onset  . Heart attack Father   . Diabetes Neg Hx   . Hyperlipidemia Neg Hx   . Hypertension Neg Hx   . Sudden death Neg Hx    History  Substance Use Topics  . Smoking status: Never Smoker   . Smokeless tobacco: Never Used  . Alcohol Use: No     Comment: occasionally     Review of Systems  Constitutional: Negative for fever and chills.  HENT: Negative for congestion.   Eyes: Negative for visual disturbance.  Respiratory: Negative for cough and chest tightness.   Cardiovascular: Negative for chest pain.  Gastrointestinal: Negative for nausea, vomiting and abdominal pain.  Skin: Positive for rash.  Neurological: Negative for dizziness, weakness and headaches.      Allergies  Review of patient's allergies indicates no known allergies.  Home Medications   Prior to Admission medications   Medication Sig Start Date End Date Taking? Authorizing Provider  acyclovir (ZOVIRAX) 400 MG tablet Take 2 tablets (800 mg total) by mouth 5 (five) times daily. 07/06/14   Layni Kreamer, PA-C  clindamycin (CLEOCIN) 150 MG capsule Take 1 capsule (150 mg total) by mouth every 6 (six) hours. 08/23/12   Ripley Fraise, MD  Multiple Vitamin (MULITIVITAMIN WITH MINERALS) TABS Take 1 tablet by mouth daily.    Historical Provider, MD  nitroGLYCERIN (NITRODUR - DOSED IN MG/24 HR) 0.2 mg/hr 1/4th patch over affected toe - change daily 05/30/12   Dene Gentry, MD  oxyCODONE-acetaminophen (PERCOCET/ROXICET) 5-325 MG per tablet Take 1 tablet by mouth every 4 (four) hours as needed for pain. 08/23/12   Ripley Fraise, MD   BP 101/73 mmHg  Pulse 65  Temp(Src) 98.2 F (36.8 C) (Oral)  Resp 18  Ht 6\' 3"  (1.905 m)  Wt 245 lb (111.131 kg)  BMI 30.62 kg/m2  SpO2 99% Physical Exam  Constitutional: He is oriented to person, place, and time. He appears well-developed and well-nourished. No distress.  HENT:  Head: Normocephalic and atraumatic.  Mouth/Throat: Oropharynx is clear and moist. No oropharyngeal exudate.  Eyes: Conjunctivae and EOM are normal. Pupils are equal, round, and reactive to light. Right eye exhibits no discharge. Left eye exhibits no discharge.  Neck: Normal range of motion. Neck supple. No tracheal deviation present.  Cardiovascular: Normal rate, regular  rhythm and normal heart sounds.  Exam reveals no friction rub.   No murmur heard. Pulses:      Radial pulses are 2+ on the right side, and 2+ on the left side.  Pulmonary/Chest: Effort normal and breath sounds normal. No respiratory distress. He has no wheezes. He has no rales.  Patient is able to speak in full sentences without difficulty Negative use of accessory muscles Negative stridor  Musculoskeletal: Normal range of motion.  Full ROM to upper and lower extremities without difficulty noted, negative ataxia noted.  Lymphadenopathy:    He has no cervical adenopathy.  Neurological: He is alert and oriented to person, place, and time. No cranial nerve deficit. He exhibits normal muscle tone. Coordination normal.  Cranial nerves III-XII grossly intact Strength 5+/5+ to upper extremities bilaterally with resistance applied, equal distribution noted Equal grip strength bilaterally Negative facial droop Negative slurred speech Negative aphasia Patient follow commands well Patient responds to questions appropriately Negative arm drift Fine motor skills intact Gait proper, proper balance - negative sway, negative drift, negative step-offs  Skin: Rash noted. He is not diaphoretic.  Shingles appearing lesion identified at the upper outer quadrant of the right breast region-erythematous base with vesicles that are clear fluid filled. Another shingle appearing lesion with erythematous base and clear fluid filled vesicles identified to the proximal inner aspect of the right arm. Erythematous base with clear fluid filled vesicles scattered to the right side of the back. Lesions identified to be unilateral following a dermatomal pattern mainly into the thoracic region.  Negative red streaks. Negative swelling of the upper extremities. Negative active drainage or bleeding. Negative signs of escortion. Negative softening of the skin.  Psychiatric: He has a normal mood and affect. His behavior is  normal. Thought content normal.  Nursing note and vitals reviewed.   ED Course  Procedures (including critical care time) Labs Review Labs Reviewed - No data to display  Imaging Review No results found.   EKG Interpretation None      MDM   Final diagnoses:  Shingles    Medications - No data to display  Filed Vitals:   07/06/14 1527  BP: 101/73  Pulse: 65  Temp: 98.2 F (36.8 C)  TempSrc: Oral  Resp: 18  Height: 6\' 3"  (1.905 m)  Weight: 245 lb (111.131 kg)  SpO2: 99%   Doubt SJS. Doubt erythema multiforme major and minor. Doubt anaphylactic reaction or allergic reaction. Patient reported that he had history of chickenpox when he was younger, high suspicion of shingles. Negative involvement of the face. Rash is unilateral following the thoracic dermatomal pattern. Negative findings that associate with zoster ophthalmicus. Negative signs of acute cellulitic infection. Negative focal neurological deficits noted. Patient stable, afebrile. Patient not septic appearing. Negative signs of respiratory distress. Discharged patient. Discharge patient with a cyclic ear. Discussed with patient to report back to the ED to be reassessed within approximately 48 hours. Discussed with patient proper care as well as precautions. Discussed with patient to closely monitor symptoms and if symptoms are to worsen or change to report back to the ED - strict return instructions given.  Patient agreed to  plan of care, understood, all questions answered.   Raymon MuttonMarissa Kadeja Granada, PA-C 07/06/14 1842  Gwyneth SproutWhitney Plunkett, MD 07/09/14 [phone]

## 2014-07-06 NOTE — ED Notes (Signed)
Pain and irritation in right axilla and on right chest, small area that is red in axilla.  Painful.  Pt is unsure what caused it

## 2014-07-09 ENCOUNTER — Encounter (HOSPITAL_BASED_OUTPATIENT_CLINIC_OR_DEPARTMENT_OTHER): Payer: Self-pay | Admitting: *Deleted

## 2014-07-09 ENCOUNTER — Emergency Department (HOSPITAL_BASED_OUTPATIENT_CLINIC_OR_DEPARTMENT_OTHER)
Admission: EM | Admit: 2014-07-09 | Discharge: 2014-07-09 | Disposition: A | Discharge status: Home / Self Care | Payer: BLUE CROSS/BLUE SHIELD | Attending: Emergency Medicine | Admitting: Emergency Medicine

## 2014-07-09 DIAGNOSIS — B029 Zoster without complications: Secondary | ICD-10-CM

## 2014-07-09 DIAGNOSIS — Z79899 Other long term (current) drug therapy: Secondary | ICD-10-CM | POA: Insufficient documentation

## 2014-07-09 MED ORDER — HYDROCODONE-ACETAMINOPHEN 5-325 MG PO TABS: 1.0000 | ORAL_TABLET | Freq: Four times a day (QID) | ORAL | Status: DC | PRN

## 2014-07-09 NOTE — ED Notes (Signed)
C/o shingle rash to right arm chest and back. Dx here and was told to come in for recheck.

## 2014-07-09 NOTE — ED Provider Notes (Signed)
CSN: [id number]     Arrival date & time 07/09/14  7829 History   First MD Initiated Contact with Patient 07/09/14 1045     Chief Complaint  Patient presents with  . Herpes Zoster     (Consider location/radiation/quality/duration/timing/severity/associated sxs/prior Treatment) HPI  44 year old male presents for a recheck in the emergency department. He was seen 3 days ago and diagnosed with shingles to the right chest wall and upper back. He has been taking acyclovir since onset. The rash has not worsened but his pain is continued. He was not given pain meds at that time and forgot to ask for some when he was discharged. He was told to come back in 2-3 days but he is not sure why. No fevers or chills. The lesions have not ruptured and he has had no increasing redness. The patient has been taking ibuprofen and Tylenol with no significant relief and had trouble sleeping last night due to pain. The pain is a burning sensation.  History reviewed. No pertinent past medical history. Past Surgical History  Procedure Laterality Date  . Surgery scrotal / testicular    . Knee surgery      meniscal debridement   Family History  Problem Relation Age of Onset  . Heart attack Father   . Diabetes Neg Hx   . Hyperlipidemia Neg Hx   . Hypertension Neg Hx   . Sudden death Neg Hx    History  Substance Use Topics  . Smoking status: Never Smoker   . Smokeless tobacco: Never Used  . Alcohol Use: No     Comment: occasionally    Review of Systems  Constitutional: Negative for fever.  Cardiovascular: Positive for chest pain.  Musculoskeletal: Positive for back pain.  Skin: Positive for rash.  All other systems reviewed and are negative.     Allergies  Review of patient's allergies indicates no known allergies.  Home Medications   Prior to Admission medications   Medication Sig Start Date End Date Taking? Authorizing Provider  acyclovir (ZOVIRAX) 400 MG tablet Take 2 tablets (800 mg total)  by mouth 5 (five) times daily. 07/06/14  Yes Marissa Sciacca, PA-C   BP 125/55 mmHg  Pulse 64  Temp(Src) 98.1 F (36.7 C) (Oral)  Resp 16  Ht  (1.905 m)  Wt 243 lb 9.6 oz (110.496 kg)  BMI 30.45 kg/m2  SpO2 100% Physical Exam  Constitutional: He is oriented to person, place, and time. He appears well-developed and well-nourished. No distress.  HENT:  Head: Normocephalic and atraumatic.  Right Ear: External ear normal.  Left Ear: External ear normal.  Nose: Nose normal.  Eyes: Right eye exhibits no discharge. Left eye exhibits no discharge.  Neck: Neck supple.  Pulmonary/Chest: Effort normal.  Abdominal: Soft. He exhibits no distension. There is no tenderness.  Musculoskeletal: He exhibits no edema.  Neurological: He is alert and oriented to person, place, and time.  Skin: Skin is warm and dry. Rash noted. Rash is vesicular. He is not diaphoretic.     Nursing note and vitals reviewed.   ED Course  Procedures (including critical care time) Labs Review Labs Reviewed - No data to display  Imaging Review No results found.   EKG Interpretation None      MDM   Final diagnoses:  Shingles rash    Patient with continued pain from his shingles rash. He has no fevers, immunocompromise, or evidence of superinfection on top of this shingles. He is well appearing otherwise. Will  give oral narcotic pain control for more severe pain and recommended continued ibuprofen and Tylenol. I will give a referral to a PCP given that this could be long-lasting any may develop a postherpetic neuralgia. At this point however he feels well and appears well and is stable for discharge.     Pricilla LovelessScott Kamaria Lucia, MD 07/09/14 [phone]

## 2014-07-09 NOTE — Discharge Instructions (Signed)

## 2015-09-27 ENCOUNTER — Emergency Department (HOSPITAL_BASED_OUTPATIENT_CLINIC_OR_DEPARTMENT_OTHER)
Admission: EM | Admit: 2015-09-27 | Discharge: 2015-09-27 | Disposition: A | Discharge status: Home / Self Care | Payer: BLUE CROSS/BLUE SHIELD | Attending: Dermatology | Admitting: Dermatology

## 2015-09-27 ENCOUNTER — Encounter (HOSPITAL_BASED_OUTPATIENT_CLINIC_OR_DEPARTMENT_OTHER): Payer: Self-pay | Admitting: *Deleted

## 2015-09-27 DIAGNOSIS — M549 Dorsalgia, unspecified: Secondary | ICD-10-CM | POA: Insufficient documentation

## 2015-09-27 DIAGNOSIS — Z5321 Procedure and treatment not carried out due to patient leaving prior to being seen by health care provider: Secondary | ICD-10-CM | POA: Insufficient documentation

## 2015-09-27 NOTE — ED Notes (Signed)
Right sided back pain x 6 days since working out at the gym.  Increased pain with moving.

## 2015-09-27 NOTE — ED Notes (Signed)
Pt to triage door asking to go back to a room.  Ambulatory.  Reports that he can no longer wait because he is in too much pain.  Pt advised that he had several people ahead of him waiting to go back but that we were working as quickly as possible to care for the patients.  Pt left the ED in no distress

## 2015-09-28 ENCOUNTER — Encounter (HOSPITAL_BASED_OUTPATIENT_CLINIC_OR_DEPARTMENT_OTHER): Payer: Self-pay | Admitting: Emergency Medicine

## 2015-09-28 ENCOUNTER — Emergency Department (HOSPITAL_BASED_OUTPATIENT_CLINIC_OR_DEPARTMENT_OTHER): Payer: Self-pay

## 2015-09-28 ENCOUNTER — Emergency Department (HOSPITAL_BASED_OUTPATIENT_CLINIC_OR_DEPARTMENT_OTHER)
Admission: EM | Admit: 2015-09-28 | Discharge: 2015-09-28 | Disposition: A | Discharge status: Home / Self Care | Payer: Self-pay | Attending: Emergency Medicine | Admitting: Emergency Medicine

## 2015-09-28 DIAGNOSIS — M5441 Lumbago with sciatica, right side: Secondary | ICD-10-CM | POA: Insufficient documentation

## 2015-09-28 DIAGNOSIS — M62838 Other muscle spasm: Secondary | ICD-10-CM

## 2015-09-28 DIAGNOSIS — M5431 Sciatica, right side: Secondary | ICD-10-CM

## 2015-09-28 MED ORDER — HYDROMORPHONE HCL 1 MG/ML IJ SOLN
1.0000 mg | Freq: Once | INTRAMUSCULAR | Status: AC
  Administered 2015-09-28: 1 mg via INTRAMUSCULAR
  Filled 2015-09-28: qty 1

## 2015-09-28 MED ORDER — KETOROLAC TROMETHAMINE 30 MG/ML IJ SOLN
30.0000 mg | Freq: Once | INTRAMUSCULAR | Status: DC
  Filled 2015-09-28: qty 1

## 2015-09-28 MED ORDER — KETOROLAC TROMETHAMINE 30 MG/ML IJ SOLN
30.0000 mg | Freq: Once | INTRAMUSCULAR | Status: AC
  Administered 2015-09-28: 30 mg via INTRAMUSCULAR

## 2015-09-28 MED ORDER — IBUPROFEN 600 MG PO TABS: 600.0000 mg | ORAL_TABLET | Freq: Four times a day (QID) | ORAL | Status: DC | PRN

## 2015-09-28 MED ORDER — METHOCARBAMOL 500 MG PO TABS: 500.0000 mg | ORAL_TABLET | Freq: Four times a day (QID) | ORAL | Status: DC | PRN

## 2015-09-28 MED ORDER — OXYCODONE-ACETAMINOPHEN 5-325 MG PO TABS: 2.0000 | ORAL_TABLET | ORAL | Status: DC | PRN

## 2015-09-28 MED ORDER — DEXAMETHASONE 6 MG PO TABS
12.0000 mg | ORAL_TABLET | Freq: Once | ORAL | Status: AC
  Administered 2015-09-28: 12 mg via ORAL
  Filled 2015-09-28: qty 2

## 2015-09-28 MED ORDER — PREDNISONE 10 MG PO TABS: 40.0000 mg | ORAL_TABLET | Freq: Every day | ORAL | Status: DC

## 2015-09-28 MED ORDER — METHOCARBAMOL 500 MG PO TABS
500.0000 mg | ORAL_TABLET | Freq: Once | ORAL | Status: AC
  Administered 2015-09-28: 500 mg via ORAL
  Filled 2015-09-28: qty 1

## 2015-09-28 MED FILL — OXYCODONE/APAP 5-325: 5-325 | 2 days supply | Qty: 15 | Fill #0

## 2015-09-28 MED FILL — predniSONE 10 MG TABS: 10 | 4 days supply | Qty: 16 | Fill #0

## 2015-09-28 MED FILL — METHOCARBAMOL 500 MG TABLET: 500 | 3 days supply | Qty: 15 | Fill #0

## 2015-09-28 MED FILL — IBUPROFEN 600 MG TABLET: 600 | 7 days supply | Qty: 30 | Fill #0

## 2015-09-28 NOTE — ED Notes (Signed)
Pt states his back went out while lifting weights last monday

## 2015-09-28 NOTE — ED Provider Notes (Signed)
CSN: [id number]     Arrival date & time 09/28/15  0700 History   First MD Initiated Contact with Patient 09/28/15 0710     Chief Complaint  Patient presents with  . Back Pain     (Consider location/radiation/quality/duration/timing/severity/associated sxs/prior Treatment) HPI Comments: 45 year old male presents for lower back pain. The patient reports that he was lifting a week ago and felt himself pull his back while doing overhead presses. He thought he was okay and continued with the rest of his workout but when he sat up from bench pressing his back was spasming. He said that he has had severe pain since that time in his right lower back and that a couple days after the onset of pain he started to feel shooting pains down his right leg. He says that he is able to walk and reports normal sensation. He says that when he bears down for a bowel movement he has increased pain but has no difficulty having bowel movements or difficulty urinating. Denies fevers or chills. Denies any history of back pain or back injury.  Patient is a 45 y.o. male presenting with back pain.  Back Pain Associated symptoms: no abdominal pain, no chest pain, no dysuria, no fever, no headaches and no weakness     History reviewed. No pertinent past medical history. Past Surgical History  Procedure Laterality Date  . Surgery scrotal / testicular    . Knee surgery      meniscal debridement   Family History  Problem Relation Age of Onset  . Heart attack Father   . Diabetes Neg Hx   . Hyperlipidemia Neg Hx   . Hypertension Neg Hx   . Sudden death Neg Hx    Social History  Substance Use Topics  . Smoking status: Never Smoker   . Smokeless tobacco: Never Used  . Alcohol Use: No     Comment: occasionally    Review of Systems  Constitutional: Negative for fever, chills and fatigue.  HENT: Negative for congestion, ear discharge, ear pain and rhinorrhea.   Eyes: Negative for visual disturbance.  Respiratory:  Negative for cough, chest tightness and shortness of breath.   Cardiovascular: Negative for chest pain, palpitations and leg swelling.  Gastrointestinal: Negative for nausea, vomiting, abdominal pain and diarrhea.  Genitourinary: Negative for dysuria, urgency, hematuria and flank pain.  Musculoskeletal: Positive for back pain. Negative for myalgias, arthralgias, gait problem, neck pain and neck stiffness.  Skin: Negative for rash.  Neurological: Negative for dizziness, weakness, light-headedness and headaches.  Hematological: Does not bruise/bleed easily.      Allergies  Review of patient's allergies indicates no known allergies.  Home Medications   Prior to Admission medications   Medication Sig Start Date End Date Taking? Authorizing Provider  ibuprofen (ADVIL,MOTRIN) 600 MG tablet Take 1 tablet (600 mg total) by mouth every 6 (six) hours as needed. 09/28/15   Leta Baptist, MD  methocarbamol (ROBAXIN) 500 MG tablet Take 1 tablet (500 mg total) by mouth every 6 (six) hours as needed for muscle spasms. 09/28/15   Leta Baptist, MD  oxyCODONE-acetaminophen (PERCOCET/ROXICET) 5-325 MG tablet Take 2 tablets by mouth every 4 (four) hours as needed for moderate pain or severe pain. 09/28/15   Leta Baptist, MD  predniSONE (DELTASONE) 10 MG tablet Take 4 tablets (40 mg total) by mouth daily. 09/28/15   Leta Baptist, MD   BP 116/66 mmHg  Pulse 59  Temp(Src) 98.2 F (36.8 C) (Oral)  Resp  18  Ht 6\' 3"  (1.905 m)  Wt 245 lb (111.131 kg)  BMI 30.62 kg/m2  SpO2 98% Physical Exam  Constitutional: He is oriented to person, place, and time. He appears well-developed and well-nourished. No distress.  HENT:  Head: Normocephalic and atraumatic.  Right Ear: External ear normal.  Left Ear: External ear normal.  Mouth/Throat: Oropharynx is clear and moist. No oropharyngeal exudate.  Eyes: EOM are normal. Pupils are equal, round, and reactive to light.  Neck: Normal range of motion. Neck  supple.  Cardiovascular: Normal rate, regular rhythm, normal heart sounds and intact distal pulses.   No murmur heard. Pulmonary/Chest: Effort normal. No respiratory distress. He has no wheezes. He has no rales.  Abdominal: Soft. He exhibits no distension. There is no tenderness.  Musculoskeletal: He exhibits no edema.       Cervical back: Normal.       Thoracic back: Normal.       Lumbar back: He exhibits decreased range of motion, tenderness, pain and spasm.       Back:  Positive straight leg raise on examination  Neurological: He is alert and oriented to person, place, and time.  No saddle anesthesia. Able to stand on toes and heels without difficulty. Able to ambulate without difficulty.  Skin: Skin is warm and dry. No rash noted. He is not diaphoretic.  Vitals reviewed.   ED Course  Procedures (including critical care time) Labs Review Labs Reviewed - No data to display  Imaging Review Dg Lumbar Spine Complete  09/28/2015  CLINICAL DATA:  Lumbago for 1 week after injury while lifting weights EXAM: LUMBAR SPINE - COMPLETE 4+ VIEW COMPARISON:  None. FINDINGS: Frontal, lateral, spot lumbosacral lateral, and bilateral oblique views were obtained. There are 5 non-rib-bearing lumbar type vertebral bodies. There is no fracture or spondylolisthesis. There is slight disc space narrowing at L4-5. The other disc spaces appear unremarkable. There is mild facet osteoarthritic change at L5-S1 bilaterally. IMPRESSION: Slight lower lumbar osteoarthritic change. No fracture or spondylolisthesis. Electronically Signed   By: Bretta BangWilliam  Woodruff III M.D.   On: 09/28/2015 07:58   I have personally reviewed and evaluated these images and lab results as part of my medical decision-making.   EKG Interpretation None      MDM  Patient was seen and evaluated in stable condition. Patient neurovascularly intact. 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 On reevaluation after treatment with  Decadron, Dilaudid, Toradol, Robaxin patient appeared improved. Discussed with patient need for outpatient follow-up. Patient was discharged with prescriptions for Percocet, Motrin, prednisone, Robaxin. He was instructed on the need for antacids or discontinuation of steroids and return if he develops indigestion. He was given strict return precautions. He and his wife expressed understanding and agreement with plan of care. Final diagnoses:  Muscle spasm  Sciatica of right side    1. Sciatica 2. Muscle spasm    Leta BaptistEmily Roe Simrin Vegh, MD 09/28/15 [phone]

## 2015-09-28 NOTE — Discharge Instructions (Signed)
You were seen and evaluated today for your lower back pain. Likely when he strained her back last week you also cause inflammation of the nerve that radiates down your right leg. Please take the medications prescribed to help with the inflammation and pain. When you're sitting for long periods of time please try to use a pillow especially a circular or donut-shaped 1 to help take pressure off of the right-side.  Follow-up outpatient as sometimes physical therapy can be necessary to help improve the symptoms. Return for any sudden weakness or difficulty urinating. If you develop indigestion taking the ibuprofen or steroids please either discontinue or use Zantac or an over-the-counter antacid.   Lumbosacral Strain Lumbosacral strain is a strain of any of the parts that make up your lumbosacral vertebrae. Your lumbosacral vertebrae are the bones that make up the lower third of your backbone. Your lumbosacral vertebrae are held together by muscles and tough, fibrous tissue (ligaments).  CAUSES  A sudden blow to your back can cause lumbosacral strain. Also, anything that causes an excessive stretch of the muscles in the low back can cause this strain. This is typically seen when people exert themselves strenuously, fall, lift heavy objects, bend, or crouch repeatedly. RISK FACTORS  Physically demanding work.  Participation in pushing or pulling sports or sports that require a sudden twist of the back (tennis, golf, baseball).  Weight lifting.  Excessive lower back curvature.  Forward-tilted pelvis.  Weak back or abdominal muscles or both.  Tight hamstrings. SIGNS AND SYMPTOMS  Lumbosacral strain may cause pain in the area of your injury or pain that moves (radiates) down your leg.  DIAGNOSIS Your health care provider can often diagnose lumbosacral strain through a physical exam. In some cases, you may need tests such as X-ray exams.  TREATMENT  Treatment for your lower back injury depends on  many factors that your clinician will have to evaluate. However, most treatment will include the use of anti-inflammatory medicines. HOME CARE INSTRUCTIONS   Avoid hard physical activities (tennis, racquetball, waterskiing) if you are not in proper physical condition for it. This may aggravate or create problems.  If you have a back problem, avoid sports requiring sudden body movements. Swimming and walking are generally safer activities.  Maintain good posture.  Maintain a healthy weight.  For acute conditions, you may put ice on the injured area.  Put ice in a plastic bag.  Place a towel between your skin and the bag.  Leave the ice on for 20 minutes, 2-3 times a day.  When the low back starts healing, stretching and strengthening exercises may be recommended. SEEK MEDICAL CARE IF:  Your back pain is getting worse.  You experience severe back pain not relieved with medicines. SEEK IMMEDIATE MEDICAL CARE IF:   You have numbness, tingling, weakness, or problems with the use of your arms or legs.  There is a change in bowel or bladder control.  You have increasing pain in any area of the body, including your belly (abdomen).  You notice shortness of breath, dizziness, or feel faint.  You feel sick to your stomach (nauseous), are throwing up (vomiting), or become sweaty.  You notice discoloration of your toes or legs, or your feet get very cold. MAKE SURE YOU:   Understand these instructions.  Will watch your condition.  Will get help right away if you are not doing well or get worse.   This information is not intended to replace advice given to you by  your health care provider. Make sure you discuss any questions you have with your health care provider.   Document Released: 12/08/2004 Document Revised: 03/21/2014 Document Reviewed: 10/17/2012 Elsevier Interactive Patient Education 2016 [employer]   Radicular Pain Radicular pain in either the arm or leg is  usually from a bulging or herniated disk in the spine. A piece of the herniated disk may press against the nerves as the nerves exit the spine. This causes pain which is felt at the tips of the nerves down the arm or leg. Other causes of radicular pain may include:  Fractures.  Heart disease.  Cancer.  An abnormal and usually degenerative state of the nervous system or nerves (neuropathy). Diagnosis may require CT or MRI scanning to determine the primary cause.  Nerves that start at the neck (nerve roots) may cause radicular pain in the outer shoulder and arm. It can spread down to the thumb and fingers. The symptoms vary depending on which nerve root has been affected. In most cases radicular pain improves with conservative treatment. Neck problems may require physical therapy, a neck collar, or cervical traction. Treatment may take many weeks, and surgery may be considered if the symptoms do not improve.  Conservative treatment is also recommended for sciatica. Sciatica causes pain to radiate from the lower back or buttock area down the leg into the foot. Often there is a history of back problems. Most patients with sciatica are better after 2 to 4 weeks of rest and other supportive care. Short term bed rest can reduce the disk pressure considerably. Sitting, however, is not a good position since this increases the pressure on the disk. You should avoid bending, lifting, and all other activities which make the problem worse. Traction can be used in severe cases. Surgery is usually reserved for patients who do not improve within the first months of treatment. Only take over-the-counter or prescription medicines for pain, discomfort, or fever as directed by your caregiver. Narcotics and muscle relaxants may help by relieving more severe pain and spasm and by providing mild sedation. Cold or massage can give significant relief. Spinal manipulation is not recommended. It can increase the degree of disc  protrusion. Epidural steroid injections are often effective treatment for radicular pain. These injections deliver medicine to the spinal nerve in the space between the protective covering of the spinal cord and back bones (vertebrae). Your caregiver can give you more information about steroid injections. These injections are most effective when given within two weeks of the onset of pain.  You should see your caregiver for follow up care as recommended. A program for neck and back injury rehabilitation with stretching and strengthening exercises is an important part of management.  SEEK IMMEDIATE MEDICAL CARE IF:  You develop increased pain, weakness, or numbness in your arm or leg.  You develop difficulty with bladder or bowel control.  You develop abdominal pain.   This information is not intended to replace advice given to you by your health care provider. Make sure you discuss any questions you have with your health care provider.   Document Released: 04/07/2004 Document Revised: 03/21/2014 Document Reviewed: 09/24/2014 Elsevier Interactive Patient Education [employer]

## 2015-09-28 NOTE — ED Notes (Signed)
Patient transported to X-ray 

## 2017-06-11 ENCOUNTER — Emergency Department (HOSPITAL_BASED_OUTPATIENT_CLINIC_OR_DEPARTMENT_OTHER): Payer: Self-pay

## 2017-06-11 ENCOUNTER — Encounter (HOSPITAL_BASED_OUTPATIENT_CLINIC_OR_DEPARTMENT_OTHER): Payer: Self-pay | Admitting: *Deleted

## 2017-06-11 ENCOUNTER — Emergency Department (HOSPITAL_BASED_OUTPATIENT_CLINIC_OR_DEPARTMENT_OTHER)
Admission: EM | Admit: 2017-06-11 | Discharge: 2017-06-12 | Disposition: A | Discharge status: Home / Self Care | Payer: Self-pay | Attending: Emergency Medicine | Admitting: Emergency Medicine

## 2017-06-11 ENCOUNTER — Other Ambulatory Visit: Payer: Self-pay

## 2017-06-11 DIAGNOSIS — K5289 Other specified noninfective gastroenteritis and colitis: Secondary | ICD-10-CM | POA: Insufficient documentation

## 2017-06-11 DIAGNOSIS — K529 Noninfective gastroenteritis and colitis, unspecified: Secondary | ICD-10-CM

## 2017-06-11 DIAGNOSIS — Z79899 Other long term (current) drug therapy: Secondary | ICD-10-CM | POA: Insufficient documentation

## 2017-06-11 LAB — URINALYSIS, ROUTINE W REFLEX MICROSCOPIC
BILIRUBIN URINE: NEGATIVE
Glucose, UA: NEGATIVE mg/dL
HGB URINE DIPSTICK: NEGATIVE
Ketones, ur: NEGATIVE mg/dL
Leukocytes, UA: NEGATIVE
Nitrite: NEGATIVE
PH: 7 (ref 5.0–8.0)
Protein, ur: NEGATIVE mg/dL
Specific Gravity, Urine: 1.01 (ref 1.005–1.030)

## 2017-06-11 LAB — COMPREHENSIVE METABOLIC PANEL
ALBUMIN: 3.9 g/dL (ref 3.5–5.0)
ALT: 23 U/L (ref 17–63)
ANION GAP: 11 (ref 5–15)
AST: 16 U/L (ref 15–41)
Alkaline Phosphatase: 92 U/L (ref 38–126)
BUN: 13 mg/dL (ref 6–20)
CALCIUM: 9.2 mg/dL (ref 8.9–10.3)
CO2: 26 mmol/L (ref 22–32)
Chloride: 100 mmol/L — ABNORMAL LOW (ref 101–111)
Creatinine, Ser: 1.4 mg/dL — ABNORMAL HIGH (ref 0.61–1.24)
GFR calc Af Amer: >60 mL/min (ref >60)
GFR calc non Af Amer: 58 mL/min — ABNORMAL LOW (ref >60)
GLUCOSE: 90 mg/dL (ref 65–99)
Potassium: 3.8 mmol/L (ref 3.5–5.1)
SODIUM: 137 mmol/L (ref 135–145)
Total Bilirubin: 1.2 mg/dL (ref 0.3–1.2)
Total Protein: 8.4 g/dL — ABNORMAL HIGH (ref 6.5–8.1)

## 2017-06-11 LAB — LIPASE, BLOOD: Lipase: 31 U/L (ref 11–51)

## 2017-06-11 LAB — CBC
HEMATOCRIT: 45.9 % (ref 39.0–52.0)
HEMOGLOBIN: 15.3 g/dL (ref 13.0–17.0)
MCH: 26.7 pg (ref 26.0–34.0)
MCHC: 33.3 g/dL (ref 30.0–36.0)
MCV: 80.1 fL (ref 78.0–100.0)
Platelets: 188 10*3/uL (ref 150–400)
RBC: 5.73 MIL/uL (ref 4.22–5.81)
RDW: 13.4 % (ref 11.5–15.5)
WBC: 6.8 10*3/uL (ref 4.0–10.5)

## 2017-06-11 MED ORDER — IOPAMIDOL (ISOVUE-300) INJECTION 61%
100.0000 mL | Freq: Once | INTRAVENOUS | Status: AC | PRN
  Administered 2017-06-11: 100 mL via INTRAVENOUS

## 2017-06-11 MED ORDER — SODIUM CHLORIDE 0.9 % IV BOLUS
1000.0000 mL | Freq: Once | INTRAVENOUS | Status: AC
Start: 2017-06-11 — End: 2017-06-11
  Administered 2017-06-11: 1000 mL via INTRAVENOUS

## 2017-06-11 MED ORDER — MORPHINE SULFATE (PF) 4 MG/ML IV SOLN
4.0000 mg | Freq: Once | INTRAVENOUS | Status: AC
  Administered 2017-06-11: 4 mg via INTRAVENOUS
  Filled 2017-06-11: qty 1

## 2017-06-11 MED ORDER — ONDANSETRON HCL 4 MG/2ML IJ SOLN
4.0000 mg | Freq: Once | INTRAMUSCULAR | Status: AC
  Administered 2017-06-11: 4 mg via INTRAVENOUS
  Filled 2017-06-11: qty 2

## 2017-06-11 NOTE — ED Provider Notes (Signed)
[facility] EMERGENCY DEPARTMENT Provider Note   CSN: [id number] Arrival date & time: 06/11/17  2005     History   Chief Complaint Chief Complaint  Patient presents with  . Abdominal Pain    HPI Roy Hill is a 47 y.o. male.  The history is provided by the patient.  Abdominal Pain    He had onset 4 days ago of generalized abdominal pain with nausea and vomiting.  He had eaten some yellow snapper and thought that may be the fish was bad.  He did not have fever or chills or sweats.  Nausea has persisted, but he has not vomited for the last 2 days.  He has had anorexia.  Abdominal pain has continued and seems to be localizing more in the mid and lower abdomen.  He rates pain at 6/10, but we will get as severe as 8/10.  Nothing makes it better, nothing makes it worse.  He is continuing to have bowel movements and pass flatus.  He has tried taking Alka-Seltzer and Gas-X without relief.  He is also now complaining of a headache.  History reviewed. No pertinent past medical history.  Patient Active Problem List   Diagnosis Date Noted  . Right foot injury 04/18/2012  . ANAL FISSURE 01/13/2010  . FLATULENCE ERUCTATION AND GAS PAIN 01/13/2010    Past Surgical History:  Procedure Laterality Date  . KNEE SURGERY     meniscal debridement  . SURGERY SCROTAL / TESTICULAR          Home Medications    Prior to Admission medications   Medication Sig Start Date End Date Taking? Authorizing Provider  ibuprofen (ADVIL,MOTRIN) 600 MG tablet Take 1 tablet (600 mg total) by mouth every 6 (six) hours as needed. 09/28/15   Leta BaptistNguyen, Emily Roe, MD  methocarbamol (ROBAXIN) 500 MG tablet Take 1 tablet (500 mg total) by mouth every 6 (six) hours as needed for muscle spasms. 09/28/15   Leta BaptistNguyen, Emily Roe, MD  oxyCODONE-acetaminophen (PERCOCET/ROXICET) 5-325 MG tablet Take 2 tablets by mouth every 4 (four) hours as needed for moderate pain or severe pain. 09/28/15   Leta BaptistNguyen, Emily Roe, MD    predniSONE (DELTASONE) 10 MG tablet Take 4 tablets (40 mg total) by mouth daily. 09/28/15   Leta BaptistNguyen, Emily Roe, MD    Family History Family History  Problem Relation Age of Onset  . Heart attack Father   . Diabetes Neg Hx   . Hyperlipidemia Neg Hx   . Hypertension Neg Hx   . Sudden death Neg Hx     Social History Social History   Tobacco Use  . Smoking status: Never Smoker  . Smokeless tobacco: Never Used  Substance Use Topics  . Alcohol use: No    Comment: occasionally  . Drug use: No     Allergies   Patient has no known allergies.   Review of Systems Review of Systems  Gastrointestinal: Positive for abdominal pain.  All other systems reviewed and are negative.    Physical Exam Updated Vital Signs BP 127/78 (BP Location: Left Arm)   Pulse 84   Temp 98.3 F (36.8 C) (Oral)   Resp 18   Ht 6\' 3"  (1.905 m)   Wt 113.4 kg (250 lb)   SpO2 98%   BMI 31.25 kg/m   Physical Exam  Nursing note and vitals reviewed.  47 year old male, resting comfortably and in no acute distress. Vital signs are normal. Oxygen saturation is 98%, which is  normal. Head is normocephalic and atraumatic. PERRLA, EOMI. Oropharynx is clear. Neck is nontender and supple without adenopathy or JVD. Back is nontender and there is no CVA tenderness. Lungs are clear without rales, wheezes, or rhonchi. Chest is nontender. Heart has regular rate and rhythm without murmur. Abdomen is soft, flat, with tenderness in the right lower quadrant which extends to the midline and right mid abdomen.  There is rebound tenderness without guarding.  There are no masses or hepatosplenomegaly and peristalsis is hypoactive. Extremities have no cyanosis or edema, full range of motion is present. Skin is warm and dry without rash. Neurologic: Mental status is normal, cranial nerves are intact, there are no motor or sensory deficits.  ED Treatments / Results  Labs (all labs ordered are listed, but only abnormal  results are displayed) Labs Reviewed  COMPREHENSIVE METABOLIC PANEL - Abnormal; Notable for the following components:      Result Value   Chloride 100 (*)    Creatinine, Ser 1.40 (*)    Total Protein 8.4 (*)    GFR calc non Af Amer 58 (*)    All other components within normal limits  LIPASE, BLOOD  CBC  URINALYSIS, ROUTINE W REFLEX MICROSCOPIC   Radiology Ct Abdomen Pelvis W Contrast  Result Date: 06/12/2017 CLINICAL DATA:  47 year old male with right lower quadrant abdominal pain. Concern for appendicitis. EXAM: CT ABDOMEN AND PELVIS WITH CONTRAST TECHNIQUE: Multidetector CT imaging of the abdomen and pelvis was performed using the standard protocol following bolus administration of intravenous contrast. CONTRAST:  ISOVUE-300 IOPAMIDOL (ISOVUE-300) INJECTION 61% COMPARISON:  Abdominal radiograph dated 01/04/2010 and lumbar spine radiograph dated 09/28/2015 FINDINGS: Lower chest: The visualized lung bases are clear. No intra-abdominal free air.  Trace free fluid within the pelvis. Hepatobiliary: No focal liver abnormality is seen. No gallstones, gallbladder wall thickening, or biliary dilatation. Pancreas: Unremarkable. No pancreatic ductal dilatation or surrounding inflammatory changes. Spleen: Normal in size without focal abnormality. Adrenals/Urinary Tract: The adrenal glands are unremarkable. Small bilateral renal parapelvic cysts. There is no hydronephrosis on either side. There is symmetric enhancement and excretion of contrast by both kidneys. The visualized ureters and urinary bladder appear unremarkable. Stomach/Bowel: There is inflammatory changes and thickening of the proximal colon involving the cecum and ascending colon. Although findings may represent colitis underlying colonic mass is not excluded. Further evaluation with colonoscopy following resolution of the acute inflammatory process recommended. There is no bowel obstruction. Mildly thickened base of the appendix secondary to  inflammatory changes of the cecum. The appendix is otherwise unremarkable. Vascular/Lymphatic: The abdominal aorta and IVC appear unremarkable. No portal venous gas. Right lower quadrant and pericecal mildly enlarged lymph nodes. Reproductive: The prostate and seminal vesicles are grossly unremarkable. Other: None Musculoskeletal: Mild disc desiccation at L4-L5. No acute osseous pathology. IMPRESSION: Inflammatory changes and thickening of the cecum and ascending colon likely colitis. Underlying colonic mass is not excluded. Further evaluation with colonoscopy following resolution of acute inflammatory process recommended. Normal appendix. Electronically Signed   By: Elgie Collard M.D.   On: 06/12/2017 00:48    Procedures Procedures (including critical care time)  Medications Ordered in ED Medications  morphine 4 MG/ML injection 4 mg (has no administration in time range)  ciprofloxacin (CIPRO) tablet 500 mg (has no administration in time range)  metroNIDAZOLE (FLAGYL) tablet 500 mg (has no administration in time range)  sodium chloride 0.9 % bolus 1,000 mL (0 mLs Intravenous Stopped 06/11/17 2340)  ondansetron (ZOFRAN) injection 4 mg (4 mg Intravenous  Given 06/11/17 2304)  morphine 4 MG/ML injection 4 mg (4 mg Intravenous Given 06/11/17 2304)  iopamidol (ISOVUE-300) 61 % injection 100 mL (100 mLs Intravenous Contrast Given 06/11/17 2324)     Initial Impression / Assessment and Plan / ED Course  I have reviewed the triage vital signs and the nursing notes.  Pertinent labs & imaging results that were available during my care of the patient were reviewed by me and considered in my medical decision making (see chart for details).  Abdominal pain with nausea and vomiting.  Tenderness seems to be fairly well localized to the right lower abdomen, so I am worried about the possibility of appendicitis.  Course is rather protracted for simple viral gastritis or food poisoning.  Consider possibility of  diverticulitis.  Doubt urolithiasis.  Initial labs are reassuring.  He will be sent for CT of abdomen and pelvis.  Old records are reviewed, and he has a prior ED visit for gastroenteritis.  CT shows no evidence of appendicitis, but does show colitis involving the ascending colon with some inflammatory changes extending to around the base of the appendix.  There is concern for possible underlying mass, and follow-up colonoscopy is recommended.  This recommendation is relayed to the patient.  He is feeling much better after above-noted treatment.  He is given initial dose of ciprofloxacin and metronidazole.  He is discharged with prescriptions for ciprofloxacin and metronidazole as well as ondansetron for nausea.  Given a prescription for small number of oxycodone-acetaminophen tablets for pain.  Return precautions discussed.  Final Clinical Impressions(s) / ED Diagnoses   Final diagnoses:  Colitis    ED Discharge Orders        Ordered    ciprofloxacin (CIPRO) 500 MG tablet  2 times daily     06/12/17 0107    metroNIDAZOLE (FLAGYL) 500 MG tablet  3 times daily     06/12/17 0107    oxyCODONE-acetaminophen (PERCOCET) 5-325 MG tablet  Every 4 hours PRN     06/12/17 0107    ondansetron (ZOFRAN) 4 MG tablet  Every 6 hours PRN     06/12/17 0107       Dione Booze, MD 06/12/17 [id number]

## 2017-06-11 NOTE — ED Notes (Signed)
ED Provider at bedside. 

## 2017-06-11 NOTE — ED Triage Notes (Signed)
Pt states that he ate fish on 4 days ago and has had abdominal cramping intermittent since that time.

## 2017-06-11 NOTE — ED Notes (Signed)
Pt reports a decrease in frequency of abd cramping after heat pack applied.

## 2017-06-12 MED ORDER — CIPROFLOXACIN HCL 500 MG PO TABS: 500.0000 mg | ORAL_TABLET | Freq: Two times a day (BID) | ORAL | 0 refills | Status: DC

## 2017-06-12 MED ORDER — ONDANSETRON HCL 4 MG PO TABS: 4.0000 mg | ORAL_TABLET | Freq: Four times a day (QID) | ORAL | 0 refills | Status: DC | PRN

## 2017-06-12 MED ORDER — CIPROFLOXACIN HCL 500 MG PO TABS
500.0000 mg | ORAL_TABLET | Freq: Once | ORAL | Status: AC
  Administered 2017-06-12: 500 mg via ORAL
  Filled 2017-06-12: qty 1

## 2017-06-12 MED ORDER — METRONIDAZOLE 500 MG PO TABS
500.0000 mg | ORAL_TABLET | Freq: Once | ORAL | Status: AC
  Administered 2017-06-12: 500 mg via ORAL
  Filled 2017-06-12: qty 1

## 2017-06-12 MED ORDER — METRONIDAZOLE 500 MG PO TABS: 500.0000 mg | ORAL_TABLET | Freq: Three times a day (TID) | ORAL | 0 refills | Status: DC

## 2017-06-12 MED ORDER — OXYCODONE-ACETAMINOPHEN 5-325 MG PO TABS: 1.0000 | ORAL_TABLET | ORAL | 0 refills | Status: DC | PRN

## 2017-06-12 MED ORDER — MORPHINE SULFATE (PF) 4 MG/ML IV SOLN
4.0000 mg | Freq: Once | INTRAVENOUS | Status: AC
  Administered 2017-06-12: 4 mg via INTRAVENOUS
  Filled 2017-06-12: qty 1

## 2017-06-12 NOTE — Discharge Instructions (Addendum)
Take the antibiotics until they are completely gone.  Please be aware that he should not have any alcohol whatsoever while you are taking metronidazole.  If you do drink alcohol, you will get very sick with severe abdominal pain.  Your symptoms should start to improve over the next several days.  If they do not, or if symptoms are getting worse, then return to the emergency department for reevaluation.  The radiologist was concerned that there might be an underlying mass in your colon.  They recommend you get a colonoscopy after the infection has fully cleared.  Please make an appointment with a gastroenterologist for this.

## 2017-06-22 ENCOUNTER — Other Ambulatory Visit: Payer: Self-pay

## 2017-06-22 ENCOUNTER — Emergency Department (HOSPITAL_BASED_OUTPATIENT_CLINIC_OR_DEPARTMENT_OTHER)
Admission: EM | Admit: 2017-06-22 | Discharge: 2017-06-22 | Disposition: A | Discharge status: Home / Self Care | Payer: Self-pay | Attending: Emergency Medicine | Admitting: Emergency Medicine

## 2017-06-22 ENCOUNTER — Encounter (HOSPITAL_BASED_OUTPATIENT_CLINIC_OR_DEPARTMENT_OTHER): Payer: Self-pay

## 2017-06-22 ENCOUNTER — Emergency Department (HOSPITAL_BASED_OUTPATIENT_CLINIC_OR_DEPARTMENT_OTHER)
Admission: EM | Admit: 2017-06-22 | Discharge: 2017-06-23 | Disposition: A | Discharge status: Home / Self Care | Payer: Self-pay | Attending: Emergency Medicine | Admitting: Emergency Medicine

## 2017-06-22 DIAGNOSIS — M25421 Effusion, right elbow: Secondary | ICD-10-CM

## 2017-06-22 DIAGNOSIS — M25521 Pain in right elbow: Secondary | ICD-10-CM

## 2017-06-22 DIAGNOSIS — R2231 Localized swelling, mass and lump, right upper limb: Secondary | ICD-10-CM | POA: Insufficient documentation

## 2017-06-22 DIAGNOSIS — I82611 Acute embolism and thrombosis of superficial veins of right upper extremity: Secondary | ICD-10-CM

## 2017-06-22 DIAGNOSIS — Z5321 Procedure and treatment not carried out due to patient leaving prior to being seen by health care provider: Secondary | ICD-10-CM | POA: Insufficient documentation

## 2017-06-22 DIAGNOSIS — I82621 Acute embolism and thrombosis of deep veins of right upper extremity: Secondary | ICD-10-CM | POA: Insufficient documentation

## 2017-06-22 NOTE — ED Triage Notes (Signed)
Pt c/o swelling. Pain to right UE/medial AC area and decreased extension-slight swelling noted -no redness-temp to touch WNL-states sx started after IV when seen here 3/31-NAD-steady gait-pt was here earlier and had to leave without being seen

## 2017-06-22 NOTE — ED Triage Notes (Signed)
  Roy Hill, Eugenio HoesKaren A, RN  Registered Nurse    ED Triage Notes  Signed  Date of Service:  06/22/2017 1:54 AM          Signed           [] Hide copied text  [] Hover for details   Pt c/o swelling/pain to right UE/medial AC area and decreased extension-slight amount of swelling noted-no redness-temp to touch WNL-states sx started after IV when seen here 3/31-NAD-steady gait          Electronically signed by Angeline SlimPresnell, Yamilet Mcfayden A, RN at 06/22/2017 1:07 PM     ED on 06/22/2017        Detailed Report

## 2017-06-22 NOTE — ED Notes (Signed)
After triage pt notified of wait -states he is unable to wait and will return later this pm-pleasant/NAD

## 2017-06-22 NOTE — ED Triage Notes (Signed)
Pt c/o swelling/pain to right UE/medial AC area and decreased extension-slight amount of swelling noted-no redness-temp to touch WNL-states sx started after IV when seen here 3/31-NAD-steady gait

## 2017-06-23 ENCOUNTER — Encounter (HOSPITAL_BASED_OUTPATIENT_CLINIC_OR_DEPARTMENT_OTHER): Payer: Self-pay

## 2017-06-23 ENCOUNTER — Other Ambulatory Visit: Payer: Self-pay

## 2017-06-23 ENCOUNTER — Emergency Department (HOSPITAL_BASED_OUTPATIENT_CLINIC_OR_DEPARTMENT_OTHER)
Admission: EM | Admit: 2017-06-23 | Discharge: 2017-06-23 | Disposition: A | Discharge status: Home / Self Care | Payer: Self-pay | Attending: Emergency Medicine | Admitting: Emergency Medicine

## 2017-06-23 ENCOUNTER — Ambulatory Visit (HOSPITAL_BASED_OUTPATIENT_CLINIC_OR_DEPARTMENT_OTHER)
Admit: 2017-06-23 | Discharge: 2017-06-23 | Disposition: A | Discharge status: Home / Self Care | Payer: Self-pay | Source: Ambulatory Visit | Attending: Emergency Medicine | Admitting: Emergency Medicine

## 2017-06-23 DIAGNOSIS — M25521 Pain in right elbow: Secondary | ICD-10-CM | POA: Insufficient documentation

## 2017-06-23 DIAGNOSIS — M25421 Effusion, right elbow: Secondary | ICD-10-CM | POA: Insufficient documentation

## 2017-06-23 DIAGNOSIS — I82621 Acute embolism and thrombosis of deep veins of right upper extremity: Secondary | ICD-10-CM | POA: Insufficient documentation

## 2017-06-23 DIAGNOSIS — I808 Phlebitis and thrombophlebitis of other sites: Secondary | ICD-10-CM | POA: Insufficient documentation

## 2017-06-23 MED ORDER — HYDROCODONE-ACETAMINOPHEN 5-325 MG PO TABS: 1.0000 | ORAL_TABLET | Freq: Four times a day (QID) | ORAL | 0 refills | Status: DC | PRN

## 2017-06-23 MED ORDER — RIVAROXABAN (XARELTO) VTE STARTER PACK (15 & 20 MG): ORAL_TABLET | ORAL | 0 refills | Status: DC

## 2017-06-23 MED ORDER — RIVAROXABAN 15 MG PO TABS
15.0000 mg | ORAL_TABLET | Freq: Once | ORAL | Status: AC
Start: 2017-06-23 — End: 2017-06-23
  Administered 2017-06-23: 15 mg via ORAL
  Filled 2017-06-23: qty 1

## 2017-06-23 MED ORDER — RIVAROXABAN 20 MG PO TABS: 20.0000 mg | ORAL_TABLET | Freq: Every day | ORAL | 1 refills | Status: DC

## 2017-06-23 MED ORDER — NAPROXEN 500 MG PO TABS: ORAL_TABLET | ORAL | 0 refills | Status: DC

## 2017-06-23 MED ORDER — NAPROXEN 250 MG PO TABS
500.0000 mg | ORAL_TABLET | Freq: Once | ORAL | Status: AC
  Administered 2017-06-23: 500 mg via ORAL
  Filled 2017-06-23: qty 2

## 2017-06-23 MED ORDER — TRAMADOL HCL 50 MG PO TABS: 50.0000 mg | ORAL_TABLET | Freq: Four times a day (QID) | ORAL | 0 refills | Status: DC | PRN

## 2017-06-23 MED ORDER — HYDROCODONE-ACETAMINOPHEN 5-325 MG PO TABS
1.0000 | ORAL_TABLET | Freq: Once | ORAL | Status: AC
  Administered 2017-06-23: 1 via ORAL
  Filled 2017-06-23: qty 1

## 2017-06-23 MED ORDER — RIVAROXABAN (XARELTO) EDUCATION KIT FOR DVT/PE PATIENTS
PACK | Freq: Once | Status: AC
  Administered 2017-06-23: 1

## 2017-06-23 MED FILL — XARELTO STARTER PACK: 15 & 20 | 28 days supply | Qty: 51 | Fill #0

## 2017-06-23 MED FILL — traMADol HCL 50 MG TABS: 50 | 2 days supply | Qty: 6 | Fill #0

## 2017-06-23 NOTE — ED Provider Notes (Signed)
[facility] EMERGENCY DEPARTMENT Provider Note   CSN: [id number] Arrival date & time: 06/23/17  1310     History   Chief Complaint Chief Complaint  Patient presents with  . DVT RUE    HPI Roy Hill is a 47 y.o. male who presents to the ED for follow up after being diagnosed with an acute right upper extremity DVT last night.  And also diagnosed with superficial thrombophlebitis of the basilic vein on the right arm.  He had an IV placed in the right arm on March 31.  2 days ago the patient started noticing pain in the upper extremity and difficulty with extension and range of motion.  He presented to the ER last night and had an sound that showed the DVT in the right brachial vein.  He denies chest pain or shortness of breath.  HPI  History reviewed. No pertinent past medical history.  Patient Active Problem List   Diagnosis Date Noted  . Right foot injury 04/18/2012  . ANAL FISSURE 01/13/2010  . FLATULENCE ERUCTATION AND GAS PAIN 01/13/2010    Past Surgical History:  Procedure Laterality Date  . KNEE SURGERY     meniscal debridement  . SURGERY SCROTAL / TESTICULAR          Home Medications    Prior to Admission medications   Medication Sig Start Date End Date Taking? Authorizing Provider  ciprofloxacin (CIPRO) 500 MG tablet Take 1 tablet (500 mg total) by mouth 2 (two) times daily. 06/12/17   Dione Booze, MD  HYDROcodone-acetaminophen (NORCO) 5-325 MG tablet Take 1 tablet by mouth every 6 (six) hours as needed for severe pain. 06/23/17   Molpus, John, MD  metroNIDAZOLE (FLAGYL) 500 MG tablet Take 1 tablet (500 mg total) by mouth 3 (three) times daily. 06/12/17   Dione Booze, MD  naproxen (NAPROSYN) 500 MG tablet Take 1 tablet twice daily as needed for pain and inflammation. 06/23/17   Molpus, John, MD  ondansetron (ZOFRAN) 4 MG tablet Take 1 tablet (4 mg total) by mouth every 6 (six) hours as needed for nausea or vomiting. 06/12/17   Dione Booze, MD     Family History Family History  Problem Relation Age of Onset  . Heart attack Father   . Diabetes Neg Hx   . Hyperlipidemia Neg Hx   . Hypertension Neg Hx   . Sudden death Neg Hx     Social History Social History   Tobacco Use  . Smoking status: Never Smoker  . Smokeless tobacco: Never Used  Substance Use Topics  . Alcohol use: No  . Drug use: No     Allergies   Patient has no known allergies.   Review of Systems Review of Systems  Ten systems reviewed and are negative for acute change, except as noted in the HPI.   Physical Exam Updated Vital Signs There were no vitals taken for this visit.  Physical Exam  Constitutional: He is oriented to person, place, and time. He appears well-developed and well-nourished. No distress.  HENT:  Head: Normocephalic and atraumatic.  Eyes: Conjunctivae are normal. No scleral icterus.  Neck: Normal range of motion. Neck supple.  Cardiovascular: Normal rate, regular rhythm and normal heart sounds.  Tender, cord like vein in the right upper extremity, no erythema.  Minimal unilateral swelling of the right upper extremity compared to the left pulse and sensation.  Pulmonary/Chest: Effort normal and breath sounds normal. No respiratory distress.  Abdominal: Soft. There is no  tenderness.  Musculoskeletal: He exhibits no edema.  Neurological: He is alert and oriented to person, place, and time.  Skin: Skin is warm and dry. He is not diaphoretic.  Psychiatric: His behavior is normal.  Nursing note and vitals reviewed.    ED Treatments / Results  Labs (all labs ordered are listed, but only abnormal results are displayed) Labs Reviewed - No data to display  EKG None  Radiology [place] Venous Img Upper Uni Right  Result Date: 06/23/2017 CLINICAL DATA:  Right upper extremity pain and edema. Recent IV placement in the right arm. EXAM: RIGHT UPPER EXTREMITY VENOUS DOPPLER ULTRASOUND TECHNIQUE: Gray-scale sonography with graded  compression, as well as color Doppler and duplex ultrasound were performed to evaluate the upper extremity deep venous system from the level of the subclavian vein and including the jugular, axillary, basilic, radial, ulnar and upper cephalic vein. Spectral Doppler was utilized to evaluate flow at rest and with distal augmentation maneuvers. COMPARISON:  None. FINDINGS: Contralateral Subclavian Vein: Respiratory phasicity is normal and symmetric with the symptomatic side. No evidence of thrombus. Normal compressibility. Internal Jugular Vein: No evidence of thrombus. Normal compressibility, respiratory phasicity and response to augmentation. Subclavian Vein: No evidence of thrombus. Normal compressibility, respiratory phasicity and response to augmentation. Axillary Vein: No evidence of thrombus. Normal compressibility, respiratory phasicity and response to augmentation. Cephalic Vein: No evidence of thrombus. Normal compressibility, respiratory phasicity and response to augmentation. Basilic Vein: There is evidence occlusive thrombus in the basilic vein of the upper arm above the elbow. Brachial Veins: Nonocclusive thrombus is identified in the brachial vein of the upper arm above the elbow. Radial Veins: No evidence of thrombus. Normal compressibility, respiratory phasicity and response to augmentation. Ulnar Veins: No evidence of thrombus. Normal compressibility, respiratory phasicity and response to augmentation. Venous Reflux:  None visualized. Other Findings:  None visualized. IMPRESSION: Superficial thrombophlebitis of the right basilic vein and deep venous nonocclusive thrombus in the right brachial vein above the elbow. Electronically Signed   By: Irish LackGlenn  Yamagata M.D.   On: 06/23/2017 12:51    Procedures Procedures (including critical care time)  Medications Ordered in ED Medications - No data to display   Initial Impression / Assessment and Plan / ED Course  I have reviewed the triage vital  signs and the nursing notes.  Pertinent labs & imaging results that were available during my care of the patient were reviewed by me and considered in my medical decision making (see chart for details).     Patient with acute right upper extremity DVT after IV catheter placed on the 31st.  He has associated thrombophlebitis.  No chest pain or shortness of breath.  Patient will be discharged with Xarelto starter pack.  I have filled out paperwork for pharmacy assistance as the patient is uninsured.  She is also given prescriptions for the next 2 months of daily Xarelto.  I discussed return precautions.  He appears appropriate for discharge at this time  Final Clinical Impressions(s) / ED Diagnoses   Final diagnoses:  Acute deep vein thrombosis (DVT) of brachial vein of right upper extremity (HCC)  Superficial thrombophlebitis of right upper extremity    ED Discharge Orders    None       Arthor CaptainHarris, Zinia Innocent, PA-C 06/23/17 1442    Phineas RealMabe, Latanya MaudlinMartha L, MD 06/23/17 1443

## 2017-06-23 NOTE — ED Notes (Signed)
ED Provider at bedside. 

## 2017-06-23 NOTE — ED Triage Notes (Signed)
Pt returned for US RUE-dx DVT-pt NAD-steady gait

## 2017-06-23 NOTE — Discharge Instructions (Signed)
Get help right away if: °You have new or increased pain, swelling, or redness in an arm or leg. °You have numbness or tingling in an arm or leg. °You have shortness of breath. °You have chest pain. °You have a rapid or irregular heartbeat. °You feel light-headed or dizzy. °You cough up blood. °There is blood in your vomit, stool, or urine. °You have a serious fall or accident, or you hit your head. °You have a severe headache or confusion. °You have a cut that will not stop bleeding °

## 2017-06-23 NOTE — ED Provider Notes (Addendum)
[facility] Provider Note: Roy Dell, MD, FACEP  CSN: [id number] MRN: [id number] ARRIVAL: 06/22/17 at 2232 ROOM: [facility]04/[facility]04   CHIEF COMPLAINT  Arm Pain   HISTORY OF PRESENT ILLNESS  06/23/17 1:14 AM Roy Hill is a 47 y.o. male who complains of pain in his right medial antecubital region after having an IV placed there on March 31.  He rates the pain as an 8 out of 10 at rest and a 10 out of 10 when he attempts to move his right elbow.  Pain is also exacerbated with palpation.  Range of motion of the right elbow is limited due to pain.  There is some associated swelling.  He denies chest pain or shortness of breath.  Consultation with the [facility] state controlled substances database reveals the patient has received 1 prescription for 10 oxycodone tablets in the past year.   History reviewed. No pertinent past medical history.  Past Surgical History:  Procedure Laterality Date  . KNEE SURGERY     meniscal debridement  . SURGERY SCROTAL / TESTICULAR      Family History  Problem Relation Age of Onset  . Heart attack Father   . Diabetes Neg Hx   . Hyperlipidemia Neg Hx   . Hypertension Neg Hx   . Sudden death Neg Hx     Social History   Tobacco Use  . Smoking status: Never Smoker  . Smokeless tobacco: Never Used  Substance Use Topics  . Alcohol use: No  . Drug use: No    Prior to Admission medications   Medication Sig Start Date End Date Taking? Authorizing Provider  ciprofloxacin (CIPRO) 500 MG tablet Take 1 tablet (500 mg total) by mouth 2 (two) times daily. 06/12/17   Dione Booze, MD  metroNIDAZOLE (FLAGYL) 500 MG tablet Take 1 tablet (500 mg total) by mouth 3 (three) times daily. 06/12/17   Dione Booze, MD  ondansetron (ZOFRAN) 4 MG tablet Take 1 tablet (4 mg total) by mouth every 6 (six) hours as needed for nausea or vomiting. 06/12/17   Dione Booze, MD  oxyCODONE-acetaminophen (PERCOCET) 5-325 MG tablet Take 1 tablet by mouth every 4  (four) hours as needed for moderate pain. 06/12/17   Dione Booze, MD    Allergies Patient has no known allergies.   REVIEW OF SYSTEMS  Negative except as noted here or in the History of Present Illness.   PHYSICAL EXAMINATION  Initial Vital Signs Blood pressure (!) 129/95, pulse 95, temperature 98.6 F (37 C), temperature source Oral, resp. rate 20, SpO2 99 %.  Examination General: Well-developed, well-nourished male in no acute distress; appearance consistent with age of record HENT: normocephalic; atraumatic Eyes: pupils equal, round and reactive to light; extraocular muscles intact Neck: supple Heart: regular rate and rhythm Lungs: clear to auscultation bilaterally Abdomen: soft; nondistended; nontender; bowel sounds present Extremities: No deformity; full range of motion except right elbow; pulses normal; tenderness and swelling of right upper arm just proximal to the antecubital fossa with a palpable cord along the track of the basilic vein, no erythema or warmth; decreased range of motion of right elbow Neurologic: Awake, alert and oriented; motor function intact in all extremities and symmetric; no facial droop Skin: Warm and dry Psychiatric: Normal mood and affect   RESULTS  Summary of this visit's results, reviewed by myself:   EKG Interpretation  Date/Time:    Ventricular Rate:    PR Interval:    QRS Duration:   QT  Interval:    QTC Calculation:   R Axis:     Text Interpretation:        Laboratory Studies: No results found for this or any previous visit (from the past 24 hour(s)). Imaging Studies: No results found.  ED COURSE  Nursing notes and initial vitals signs, including pulse oximetry, reviewed.  Vitals:   06/22/17 2240  BP: (!) 129/95  Pulse: 95  Resp: 20  Temp: 98.6 F (37 C)  TempSrc: Oral  SpO2: 99%   Examination and history consistent with superficial thrombophlebitis of the basilic vein.  We will have him return for Doppler  ultrasound to rule out deep vein thrombosis.  PROCEDURES    ED DIAGNOSES     ICD-10-CM   1. Acute basilic vein thrombosis, right I82.611        Dent Plantz, Jonny RuizJohn, MD 06/23/17 0125    Paula LibraMolpus, Rashena Dowling, MD 06/23/17 0126

## 2017-07-24 ENCOUNTER — Encounter: Payer: Self-pay | Admitting: Surgery

## 2017-07-24 ENCOUNTER — Telehealth: Payer: Self-pay

## 2017-07-24 ENCOUNTER — Telehealth: Payer: Self-pay | Admitting: Surgery

## 2017-07-24 NOTE — Telephone Encounter (Signed)
Call received from Michel Bickers, RN CM requesting a hospital follow up appointment for the patient.  He needs follow up in order to continue to receive his xarelto from [facility] pharmacy.  He will also need to apply for the [facility] card.   An appointment was scheduled with Dr Alvis Lemmings 07/26/17 @ 1010 and [name] informed the patient of the appointment.

## 2017-07-24 NOTE — Telephone Encounter (Signed)
ED CM received call from Verlin Fester PharmD at [facility] concerning patient needing assistance with Xarelto.  Patient has received 30 day free and needs assistance obtaining maintenance supply of Xarelto. ED CM contacted patient by phone to discuss the medication assistance and  transitional care follow up. CM explained that I arranged with  [facility] to assist with 30 days of Xarelto, and  patient can pick it up today.  Also discussed that patient will need to establish care with the [facility]  clinic as well patient was agreeable with transitional care plan. Updated patient the the clinic CM will contact him by phone to schedule appointment. Provided patient with ED CM contact information should any additional questions or concerns should arise. No further ED CM needs identified.

## 2017-07-26 ENCOUNTER — Encounter: Payer: Self-pay | Admitting: Family Medicine

## 2017-07-26 ENCOUNTER — Ambulatory Visit: Payer: Self-pay | Attending: Family Medicine | Admitting: Family Medicine

## 2017-07-26 DIAGNOSIS — Z7901 Long term (current) use of anticoagulants: Secondary | ICD-10-CM | POA: Insufficient documentation

## 2017-07-26 DIAGNOSIS — I82629 Acute embolism and thrombosis of deep veins of unspecified upper extremity: Secondary | ICD-10-CM | POA: Insufficient documentation

## 2017-07-26 DIAGNOSIS — I82621 Acute embolism and thrombosis of deep veins of right upper extremity: Secondary | ICD-10-CM

## 2017-07-26 HISTORY — DX: Acute embolism and thrombosis of deep veins of unspecified upper extremity: I82.629

## 2017-07-26 MED ORDER — RIVAROXABAN 20 MG PO TABS: 20.0000 mg | ORAL_TABLET | Freq: Every day | ORAL | 1 refills | Status: DC

## 2017-07-26 NOTE — Patient Instructions (Signed)
Deep Vein Thrombosis Deep vein thrombosis (DVT) is a condition in which a blood clot forms in a deep vein, such as a lower leg, thigh, or arm vein. A clot is blood that has thickened into a gel or solid. This condition is dangerous. It can lead to serious and even life-threatening complications if the clot travels to the lungs and causes a blockage (pulmonary embolism). It can also damage veins in the leg. This can result in leg pain, swelling, discoloration, and sores (post-thrombotic syndrome). What are the causes? This condition may be caused by:  A slowdown of blood flow.  Damage to a vein.  A condition that makes blood clot more easily.  What increases the risk? The following factors may make you more likely to develop this condition:  Being overweight.  Being elderly, especially over age 60.  Sitting or lying down for more than four hours.  Lack of physical activity (sedentary lifestyle).  Being pregnant, giving birth, or having recently given birth.  Taking medicines that contain estrogen.  Smoking.  A history of any of the following: ? Blood clots or blood clotting disease. ? Peripheral vascular disease. ? Inflammatory bowel disease. ? Cancer. ? Heart disease. ? Genetic conditions that affect how blood clots. ? Neurological diseases that affect the legs (leg paresis). ? Injury. ? Major or lengthy surgery. ? A central line placed inside a large vein.  What are the signs or symptoms? Symptoms of this condition include:  Swelling, pain, or tenderness in an arm or leg.  Warmth, redness, or discoloration in an arm or leg.  If the clot is in your leg, symptoms may be more noticeable or worse when you stand or walk. Some people do not have any symptoms. How is this diagnosed? This condition is diagnosed with:  A medical history.  A physical exam.  Tests, such as: ? Blood tests. These are done to see how your blood clots. ? Imaging tests. These are done to  check for clots. Tests may include:  Ultrasound.  CT scan.  MRI.  X-ray.  Venogram. For this test, X-rays are taken after a dye is injected into a vein.  How is this treated? Treatment for this condition depends on the cause, your risk for bleeding or developing more clots, and any medical conditions you have. Treatment may include:  Taking blood thinners (also called anticoagulants). These medicines may be taken by mouth, injected under the skin, or injected through an IV tube (catheter). These medicines prevent clots from forming.  Injecting medicine that dissolves blood clots into the affected vein (catheter-directed thrombolysis).  Having surgery. Surgery may be done to: ? Remove the clot. ? Place a filter in a large vein to catch blood clots before they reach the lungs.  Some treatments may be continued for up to six months. Follow these instructions at home: If you are taking an oral blood thinner:  Take the medicine exactly as told by your health care provider. Some blood thinners need to be taken at the same time every day. Do not skip a dose.  Ask your health care provider about what foods and drugs interact with the medicine.  Ask about possible side effects. General instructions  Blood thinners can cause easy bruising and difficulty stopping bleeding. Because of this, if you are taking or were given a blood thinner: ? Hold pressure over cuts for longer than usual. ? Tell your dentist and other health care providers that you are taking blood thinners before   having any procedures that can cause bleeding. ? Avoid contact sports.  Take over-the-counter and prescription medicines only as told by your health care provider.  Return to your normal activities as told by your health care provider. Ask your health care provider what activities are safe for you.  Wear compression stockings if recommended by your health care provider.  Keep all follow-up visits as told by  your health care provider. This is important. How is this prevented? To lower your risk of developing this condition again:  For 30 or more minutes every day, do an activity that: ? Involves moving your arms and legs. ? Increases your heart rate.  When traveling for longer than four hours: ? Exercise your arms and legs every hour. ? Drink plenty of water. ? Avoid drinking alcohol.  Avoid sitting or lying for a long time without moving your legs.  Stay a healthy weight.  If you are a woman who is older than age 35, avoid unnecessary use of medicines that contain estrogen.  Do not use any products that contain nicotine or tobacco, such as cigarettes and e-cigarettes. This is especially important if you take estrogen medicines. If you need help quitting, ask your health care provider.  Contact a health care provider if:  You miss a dose of your blood thinner.  You have nausea, vomiting, or diarrhea that lasts for more than one day.  Your menstrual period is heavier than usual.  You have unusual bruising. Get help right away if:  You have new or increased pain, swelling, or redness in an arm or leg.  You have numbness or tingling in an arm or leg.  You have shortness of breath.  You have chest pain.  You have a rapid or irregular heartbeat.  You feel light-headed or dizzy.  You cough up blood.  There is blood in your vomit, stool, or urine.  You have a serious fall or accident, or you hit your head.  You have a severe headache or confusion.  You have a cut that will not stop bleeding. These symptoms may represent a serious problem that is an emergency. Do not wait to see if the symptoms will go away. Get medical help right away. Call your local emergency services (911 in the [place] Do not drive yourself to the hospital. Summary  DVT is a condition in which a blood clot forms in a deep vein, such as a lower leg, thigh, or arm vein.  Symptoms can include swelling,  warmth, pain, and redness in your leg or arm.  Treatment may include taking blood thinners, injecting medicine that dissolves blood clots,wearing compression stockings, or surgery.  If you are prescribed blood thinners, take them exactly as told. This information is not intended to replace advice given to you by your health care provider. Make sure you discuss any questions you have with your health care provider. Document Released: 02/28/2005 Document Revised: 04/02/2016 Document Reviewed: 04/02/2016 Elsevier Interactive Patient Education  2018 [employer]  

## 2017-07-26 NOTE — Progress Notes (Signed)
Subjective:  Patient ID: Roy Hill, male    DOB: 1970/05/22  Age: 47 y.o. MRN: [id number]  CC: Establish Care and Hospitalization Follow-up   HPI [name] is a 47 year old male  diagnosed with right acute deep vein thrombosis of the  brachial vein in 06/2017 (thought to be provoked by a catheter placed on 06/11/2017) and has been on anticoagulation with Xarelto who presents today for follow-up visit. He was seen at [facility] hospital ED on 07/20/2017 after he had presented with chest pains and shortness of breath, VQ scan was negative for PE and he was subsequently discharged.  He presents today denying shortness of breath, chest pain, swelling in any of his extremities and has no pain at this time.  He is unhappy that he was informed at the time of diagnosis of his DVT to abstain from intense workouts as he is used to going to the gym and doing heavy lifting.  He was also informed he would remain on anticoagulation for 3 months. He has no known family history of embolic disorder.  Past Medical History:  Diagnosis Date  . Acute deep vein thrombosis (DVT) of brachial vein (HCC) 07/26/2017    Past Surgical History:  Procedure Laterality Date  . KNEE SURGERY     meniscal debridement  . SURGERY SCROTAL / TESTICULAR      No Known Allergies   Outpatient Medications Prior to Visit  Medication Sig Dispense Refill  . Rivaroxaban 15 & 20 MG TBPK Take as directed on package: Start with one  tablet by mouth twice a day with food. On Day 22, switch to one  tablet once a day with food. 51 each 0  . rivaroxaban (XARELTO) 20 MG TABS tablet Take 1 tablet (20 mg total) by mouth daily with supper. 30 tablet 1  . ciprofloxacin (CIPRO) 500 MG tablet Take 1 tablet (500 mg total) by mouth 2 (two) times daily. (Patient not taking: Reported on 07/26/2017) 20 tablet 0  . HYDROcodone-acetaminophen (NORCO) 5-325 MG tablet Take 1 tablet by mouth every 6 (six) hours as needed for  severe pain. (Patient not taking: Reported on 07/26/2017) 6 tablet 0  . metroNIDAZOLE (FLAGYL) 500 MG tablet Take 1 tablet (500 mg total) by mouth 3 (three) times daily. (Patient not taking: Reported on 07/26/2017) 30 tablet 0  . ondansetron (ZOFRAN) 4 MG tablet Take 1 tablet (4 mg total) by mouth every 6 (six) hours as needed for nausea or vomiting. (Patient not taking: Reported on 07/26/2017) 12 tablet 0  . traMADol (ULTRAM) 50 MG tablet Take 1 tablet (50 mg total) by mouth every 6 (six) hours as needed. (Patient not taking: Reported on 07/26/2017) 6 tablet 0   No facility-administered medications prior to visit.     ROS Review of Systems  Constitutional: Negative for activity change and appetite change.  HENT: Negative for sinus pressure and sore throat.   Eyes: Negative for visual disturbance.  Respiratory: Negative for cough, chest tightness and shortness of breath.   Cardiovascular: Negative for chest pain and leg swelling.  Gastrointestinal: Negative for abdominal distention, abdominal pain, constipation and diarrhea.  Endocrine: Negative.   Genitourinary: Negative for dysuria.  Musculoskeletal: Negative for joint swelling and myalgias.  Skin: Negative for rash.  Allergic/Immunologic: Negative.   Neurological: Negative for weakness, light-headedness and numbness.  Psychiatric/Behavioral: Negative for dysphoric mood and suicidal ideas.    Objective:  BP 129/84   Pulse 64   Temp 98.1 F (36.7 C) (  Oral)   Ht  (1.905 m)   Wt 251 lb 3.2 oz (113.9 kg)   SpO2 99%   BMI 31.40 kg/m   BP/Weight 07/26/2017 06/23/2017 06/22/2017  Systolic BP 129 155 129  Diastolic BP 84 129 95  Wt. (Lbs) 251.2 249 -  BMI 31.4 31.12 -      Physical Exam  Constitutional: He is oriented to person, place, and time. He appears well-developed and well-nourished.  Cardiovascular: Normal rate, normal heart sounds and intact distal pulses.  No murmur heard. Pulmonary/Chest: Effort normal and breath  sounds normal. He has no wheezes. He has no rales. He exhibits no tenderness.  Abdominal: Soft. Bowel sounds are normal. He exhibits no distension and no mass. There is no tenderness.  Musculoskeletal: Normal range of motion.  Neurological: He is alert and oriented to person, place, and time.  Skin: Skin is warm and dry.  Psychiatric: He has a normal mood and affect.     Assessment & Plan:   1. Acute deep vein thrombosis (DVT) of brachial vein of right upper extremity (HCC) Stable Likely provoked by IV insertion Continue anticoagulation until 09/2017 after which we will perform hypercoagulable work-up - rivaroxaban (XARELTO) 20 MG TABS tablet; Take 1 tablet (20 mg total) by mouth daily with supper.  Dispense: 30 tablet; Refill: 1   Meds ordered this encounter  Medications  . rivaroxaban (XARELTO) 20 MG TABS tablet    Sig: Take 1 tablet (20 mg total) by mouth daily with supper.    Dispense:  30 tablet    Refill:  1    Follow-up: Return in about 2 months (around 10/04/2017) for follow up of DVT.   Hoy Register MD

## 2017-07-27 ENCOUNTER — Encounter: Payer: Self-pay | Admitting: Family Medicine

## 2017-08-24 MED FILL — XARELTO 20 MG TABLET: 20 | 30 days supply | Qty: 30 | Fill #0

## 2017-09-25 ENCOUNTER — Encounter: Payer: Self-pay | Admitting: Family Medicine

## 2017-09-25 ENCOUNTER — Ambulatory Visit: Payer: Self-pay | Attending: Family Medicine | Admitting: Family Medicine

## 2017-09-25 VITALS — BP 126/88 | HR 74 | Temp 98.6°F | Resp 18 | Ht 75.0 in | Wt 261.0 lb

## 2017-09-25 DIAGNOSIS — I82621 Acute embolism and thrombosis of deep veins of right upper extremity: Secondary | ICD-10-CM | POA: Insufficient documentation

## 2017-09-25 DIAGNOSIS — D6851 Activated protein C resistance: Secondary | ICD-10-CM | POA: Insufficient documentation

## 2017-09-25 DIAGNOSIS — Z7901 Long term (current) use of anticoagulants: Secondary | ICD-10-CM | POA: Insufficient documentation

## 2017-09-25 NOTE — Progress Notes (Signed)
Subjective:  Patient ID: Roy BroachWillie G Bossman, male    DOB: 09/09/1970  Age: 47 y.o. MRN: [id number]  CC: Follow-up (clot)   HPI Roy Hill is a 47 year old male  diagnosed with right acute deep vein thrombosis of the  brachial vein in 06/2017 (thought to be provoked by a catheter placed on 06/11/2017) and has been on anticoagulation with Xarelto who presents today for follow-up visit. He took his last dose of Xarelto yesterday and denies tenderness in his right upper extremity or swelling but is concerned about the presence of a depression in his right upper arm which was not present previously and thinks there is a hardness in the area as well. He denies a family history of blood clots.  Past Medical History:  Diagnosis Date  . Acute deep vein thrombosis (DVT) of brachial vein (HCC) 07/26/2017    Past Surgical History:  Procedure Laterality Date  . KNEE SURGERY     meniscal debridement  . SURGERY SCROTAL / TESTICULAR      No Known Allergies   Outpatient Medications Prior to Visit  Medication Sig Dispense Refill  . ciprofloxacin (CIPRO) 500 MG tablet Take 1 tablet (500 mg total) by mouth 2 (two) times daily. (Patient not taking: Reported on 07/26/2017) 20 tablet 0  . HYDROcodone-acetaminophen (NORCO) 5-325 MG tablet Take 1 tablet by mouth every 6 (six) hours as needed for severe pain. (Patient not taking: Reported on 07/26/2017) 6 tablet 0  . metroNIDAZOLE (FLAGYL) 500 MG tablet Take 1 tablet (500 mg total) by mouth 3 (three) times daily. (Patient not taking: Reported on 07/26/2017) 30 tablet 0  . ondansetron (ZOFRAN) 4 MG tablet Take 1 tablet (4 mg total) by mouth every 6 (six) hours as needed for nausea or vomiting. (Patient not taking: Reported on 07/26/2017) 12 tablet 0  . rivaroxaban (XARELTO) 20 MG TABS tablet Take 1 tablet (20 mg total) by mouth daily with supper. 30 tablet 1  . Rivaroxaban 15 & 20 MG TBPK Take as directed on package: Start with one 15mg  tablet by mouth twice  a day with food. On Day 22, switch to one 20mg  tablet once a day with food. 51 each 0  . traMADol (ULTRAM) 50 MG tablet Take 1 tablet (50 mg total) by mouth every 6 (six) hours as needed. (Patient not taking: Reported on 07/26/2017) 6 tablet 0   No facility-administered medications prior to visit.     ROS Review of Systems  Constitutional: Negative for activity change and appetite change.  HENT: Negative for sinus pressure and sore throat.   Eyes: Negative for visual disturbance.  Respiratory: Negative for cough, chest tightness and shortness of breath.   Cardiovascular: Negative for chest pain and leg swelling.  Gastrointestinal: Negative for abdominal distention, abdominal pain, constipation and diarrhea.  Endocrine: Negative.   Genitourinary: Negative for dysuria.  Musculoskeletal:       See hpi  Skin: Negative for rash.  Allergic/Immunologic: Negative.   Neurological: Negative for weakness, light-headedness and numbness.  Psychiatric/Behavioral: Negative for dysphoric mood and suicidal ideas.    Objective:  BP 126/88 (BP Location: Right Arm, Patient Position: Sitting, Cuff Size: Large)   Pulse 74   Temp 98.6 F (37 C) (Oral)   Resp 18   Ht 6\' 3"  (1.905 m)   Wt 261 lb (118.4 kg)   BMI 32.62 kg/m   BP/Weight 09/25/2017 07/26/2017 06/23/2017  Systolic BP 126 129 155  Diastolic BP 88 84 129  Wt. (Lbs) 261 251.2  249  BMI 32.62 31.4 31.12     Physical Exam  Constitutional: He is oriented to person, place, and time. He appears well-developed and well-nourished.  Cardiovascular: Normal rate, normal heart sounds and intact distal pulses.  No murmur heard. Pulmonary/Chest: Effort normal and breath sounds normal. He has no wheezes. He has no rales. He exhibits no tenderness.  Abdominal: Soft. Bowel sounds are normal. He exhibits no distension and no mass. There is no tenderness.  Musculoskeletal: Normal range of motion.  Neurological: He is alert and oriented to person, place,  and time.  Skin:  Linear indentation on antecubital aspect of right arm with no tenderness  Psychiatric: He has a normal mood and affect.     Assessment & Plan:   1. Acute deep vein thrombosis (DVT) of brachial vein of right upper extremity (HCC) Completed 3 months of anticoagulation with Xarelto DVT was thought to have been provoked by an IV in his right arm We will send of hypercoagulable panel in 2 weeks and he has been advised to discontinue Xarelto - Factor V Leiden - Homocysteine - Lupus anticoagulant panel - Protein C Deficiency Profile - Protein S, total and free   No orders of the defined types were placed in this encounter.   Follow-up: Return if symptoms worsen or fail to improve.   Hoy Register MD

## 2017-09-25 NOTE — Progress Notes (Signed)
Labs were put in epic on 7/15. Orders can be printed from the 7/15 scheduled appointment.

## 2017-09-25 NOTE — Progress Notes (Signed)
98%

## 2017-09-25 NOTE — Patient Instructions (Signed)
Deep Vein Thrombosis Deep vein thrombosis (DVT) is a condition in which a blood clot forms in a deep vein, such as a lower leg, thigh, or arm vein. A clot is blood that has thickened into a gel or solid. This condition is dangerous. It can lead to serious and even life-threatening complications if the clot travels to the lungs and causes a blockage (pulmonary embolism). It can also damage veins in the leg. This can result in leg pain, swelling, discoloration, and sores (post-thrombotic syndrome). What are the causes? This condition may be caused by:  A slowdown of blood flow.  Damage to a vein.  A condition that makes blood clot more easily.  What increases the risk? The following factors may make you more likely to develop this condition:  Being overweight.  Being elderly, especially over age 60.  Sitting or lying down for more than four hours.  Lack of physical activity (sedentary lifestyle).  Being pregnant, giving birth, or having recently given birth.  Taking medicines that contain estrogen.  Smoking.  A history of any of the following: ? Blood clots or blood clotting disease. ? Peripheral vascular disease. ? Inflammatory bowel disease. ? Cancer. ? Heart disease. ? Genetic conditions that affect how blood clots. ? Neurological diseases that affect the legs (leg paresis). ? Injury. ? Major or lengthy surgery. ? A central line placed inside a large vein.  What are the signs or symptoms? Symptoms of this condition include:  Swelling, pain, or tenderness in an arm or leg.  Warmth, redness, or discoloration in an arm or leg.  If the clot is in your leg, symptoms may be more noticeable or worse when you stand or walk. Some people do not have any symptoms. How is this diagnosed? This condition is diagnosed with:  A medical history.  A physical exam.  Tests, such as: ? Blood tests. These are done to see how your blood clots. ? Imaging tests. These are done to  check for clots. Tests may include:  Ultrasound.  CT scan.  MRI.  X-ray.  Venogram. For this test, X-rays are taken after a dye is injected into a vein.  How is this treated? Treatment for this condition depends on the cause, your risk for bleeding or developing more clots, and any medical conditions you have. Treatment may include:  Taking blood thinners (also called anticoagulants). These medicines may be taken by mouth, injected under the skin, or injected through an IV tube (catheter). These medicines prevent clots from forming.  Injecting medicine that dissolves blood clots into the affected vein (catheter-directed thrombolysis).  Having surgery. Surgery may be done to: ? Remove the clot. ? Place a filter in a large vein to catch blood clots before they reach the lungs.  Some treatments may be continued for up to six months. Follow these instructions at home: If you are taking an oral blood thinner:  Take the medicine exactly as told by your health care provider. Some blood thinners need to be taken at the same time every day. Do not skip a dose.  Ask your health care provider about what foods and drugs interact with the medicine.  Ask about possible side effects. General instructions  Blood thinners can cause easy bruising and difficulty stopping bleeding. Because of this, if you are taking or were given a blood thinner: ? Hold pressure over cuts for longer than usual. ? Tell your dentist and other health care providers that you are taking blood thinners before   having any procedures that can cause bleeding. ? Avoid contact sports.  Take over-the-counter and prescription medicines only as told by your health care provider.  Return to your normal activities as told by your health care provider. Ask your health care provider what activities are safe for you.  Wear compression stockings if recommended by your health care provider.  Keep all follow-up visits as told by  your health care provider. This is important. How is this prevented? To lower your risk of developing this condition again:  For 30 or more minutes every day, do an activity that: ? Involves moving your arms and legs. ? Increases your heart rate.  When traveling for longer than four hours: ? Exercise your arms and legs every hour. ? Drink plenty of water. ? Avoid drinking alcohol.  Avoid sitting or lying for a long time without moving your legs.  Stay a healthy weight.  If you are a woman who is older than age 35, avoid unnecessary use of medicines that contain estrogen.  Do not use any products that contain nicotine or tobacco, such as cigarettes and e-cigarettes. This is especially important if you take estrogen medicines. If you need help quitting, ask your health care provider.  Contact a health care provider if:  You miss a dose of your blood thinner.  You have nausea, vomiting, or diarrhea that lasts for more than one day.  Your menstrual period is heavier than usual.  You have unusual bruising. Get help right away if:  You have new or increased pain, swelling, or redness in an arm or leg.  You have numbness or tingling in an arm or leg.  You have shortness of breath.  You have chest pain.  You have a rapid or irregular heartbeat.  You feel light-headed or dizzy.  You cough up blood.  There is blood in your vomit, stool, or urine.  You have a serious fall or accident, or you hit your head.  You have a severe headache or confusion.  You have a cut that will not stop bleeding. These symptoms may represent a serious problem that is an emergency. Do not wait to see if the symptoms will go away. Get medical help right away. Call your local emergency services (911 in the [place] Do not drive yourself to the hospital. Summary  DVT is a condition in which a blood clot forms in a deep vein, such as a lower leg, thigh, or arm vein.  Symptoms can include swelling,  warmth, pain, and redness in your leg or arm.  Treatment may include taking blood thinners, injecting medicine that dissolves blood clots,wearing compression stockings, or surgery.  If you are prescribed blood thinners, take them exactly as told. This information is not intended to replace advice given to you by your health care provider. Make sure you discuss any questions you have with your health care provider. Document Released: 02/28/2005 Document Revised: 04/02/2016 Document Reviewed: 04/02/2016 Elsevier Interactive Patient Education  2018 [employer]  

## 2017-10-09 ENCOUNTER — Telehealth: Payer: Self-pay | Admitting: Family Medicine

## 2017-10-09 NOTE — Telephone Encounter (Signed)
I was called to the lab to see Roy Hill who came in to draw hypercoagulable labs and was insisting he was informed at his last visit he would have a repeat ultrasound to evaluate resolution of his right upper extremity DVT. I have explained to him the standard of care for management of his IV provoked DVT and he has completed a 3 month course of anticoagulation with Xarelto, no repeat DVT indicated as he is asymptomatic but he insists on having repeat ultrasound done and is unhappy that this is not being ordered.

## 2017-10-13 ENCOUNTER — Telehealth: Payer: Self-pay

## 2017-10-13 ENCOUNTER — Other Ambulatory Visit: Payer: Self-pay | Admitting: Family Medicine

## 2017-10-13 DIAGNOSIS — R7983 Abnormal findings of blood amino-acid level: Secondary | ICD-10-CM

## 2017-10-13 DIAGNOSIS — E7219 Other disorders of sulfur-bearing amino-acid metabolism: Principal | ICD-10-CM

## 2017-10-13 LAB — PROTEIN S, TOTAL AND FREE
Protein S Ag, Free: 125 % (ref 57–157)
Protein S Ag, Total: 92 % (ref 60–150)

## 2017-10-13 LAB — PROTEIN C DEFICIENCY PROFILE
PROTEIN C ACTIVITY: 105 % (ref 73–180)
PROTEIN C ANTIGEN: 77 % (ref 60–150)

## 2017-10-13 LAB — FACTOR V LEIDEN

## 2017-10-13 LAB — LUPUS ANTICOAGULANT PANEL
DRVVT: 36.4 s (ref 0.0–47.0)
PTT LA: 30.6 s (ref 0.0–51.9)

## 2017-10-13 LAB — HOMOCYSTEINE: Homocysteine: 17.6 umol/L — ABNORMAL HIGH (ref 0.0–15.0)

## 2017-10-13 NOTE — Telephone Encounter (Signed)
Patient was called and informed of lab results and referral. Patient requested a copy of lab results.

## 2017-10-18 ENCOUNTER — Other Ambulatory Visit: Payer: Self-pay | Admitting: Family Medicine

## 2017-10-18 MED ORDER — FOLIC ACID 1 MG PO TABS: 1.0000 mg | ORAL_TABLET | Freq: Every day | ORAL | 3 refills | Status: AC

## 2017-10-18 NOTE — Progress Notes (Unsigned)
I had referred him to Hematology due to elevated homocysteine levels in the setting of provoked right arm DVT.  No visit scheduled but rather the Hematologist replied with recommendations for commencing folic acid which I have sent to his pharmacy.  Please inform the patient accordingly.Thank you.

## 2017-10-19 ENCOUNTER — Telehealth: Payer: Self-pay | Admitting: Family Medicine

## 2017-10-19 NOTE — Progress Notes (Signed)
Patient was informed by Hematologist office of doctors orders.

## 2017-10-19 NOTE — Telephone Encounter (Signed)
Patient called and he requested [facility].

## 2017-10-20 MED FILL — FOLIC ACID 1 MG TABS: 1 | 30 days supply | Qty: 30 | Fill #0

## 2017-10-20 NOTE — Telephone Encounter (Signed)
[facility] te [facility]

## 2018-08-28 IMAGING — US US EXTREM  UP VENOUS*R*
1 series · 13 of 24 positions shown · non-contrast
Comparison: None.

CLINICAL DATA: Right upper extremity pain and edema. Recent IV
placement in the right arm.



[Series 1: us extrem up venous*right* · 0.11mm/px · 13 of 52 slices shown]
[im 1/52]
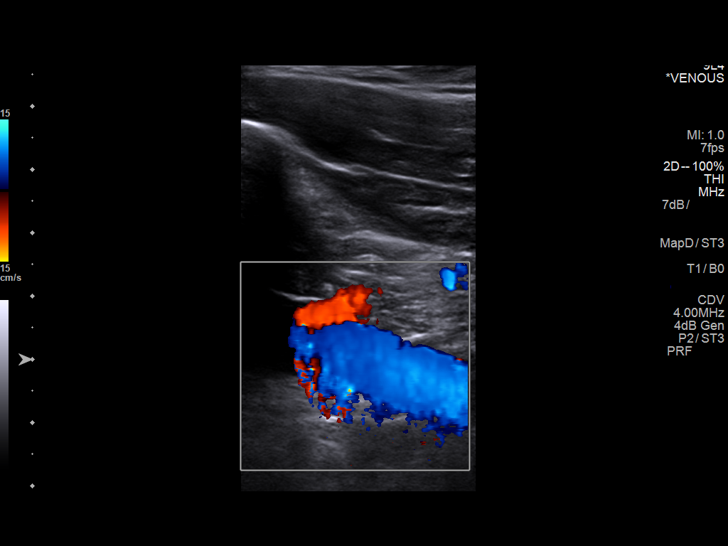
[im 5/52]
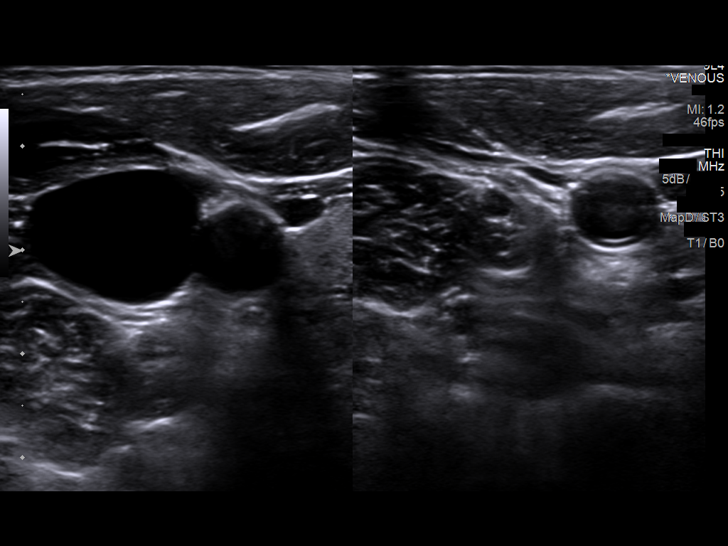
[im 9/52]
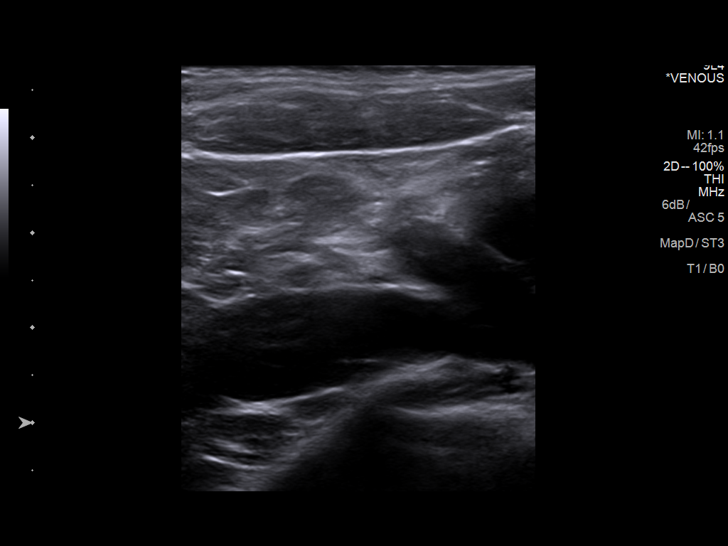
[im 14/52]
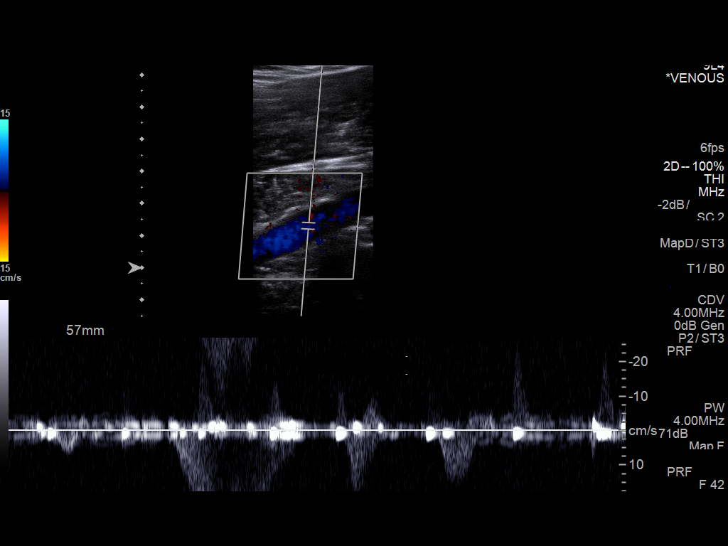
[im 18/52]
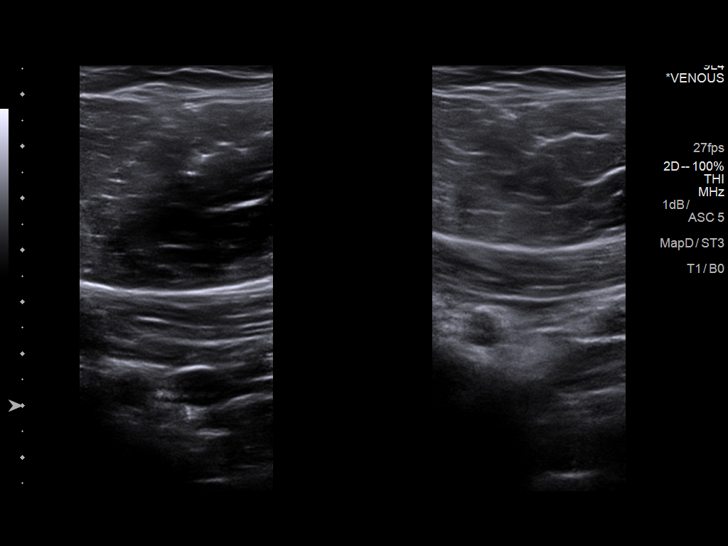
[im 23/52]
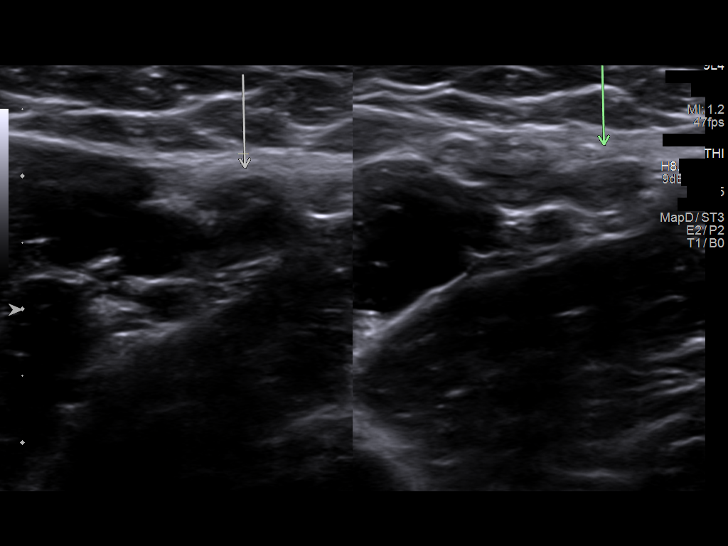
[im 27/52]
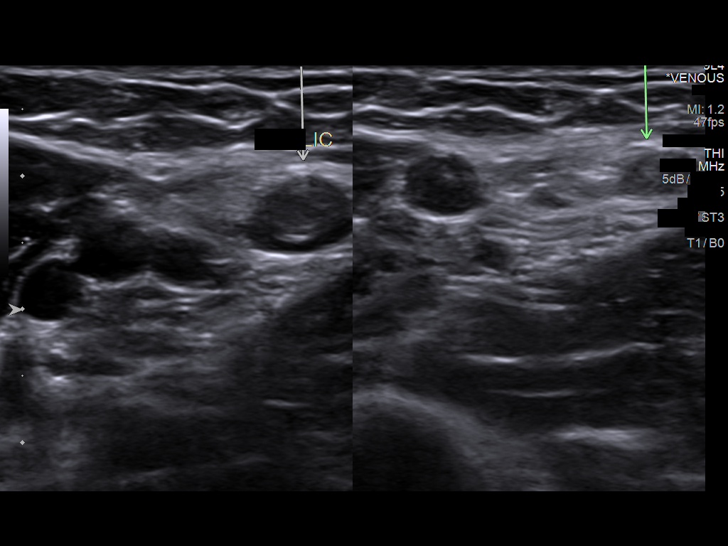
[im 29/52]
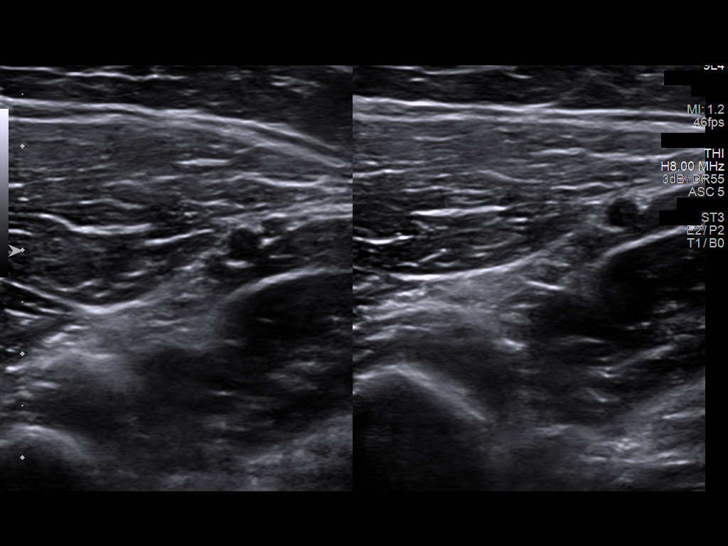
[im 34/52]
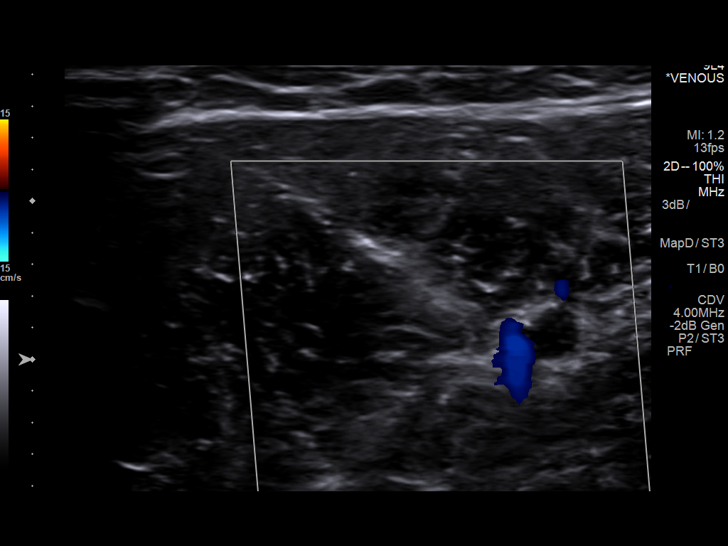
[im 38/52]
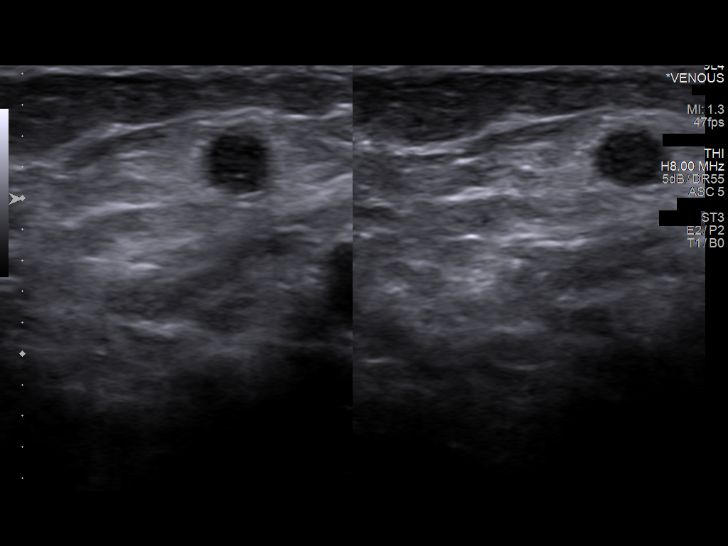
[im 43/52]
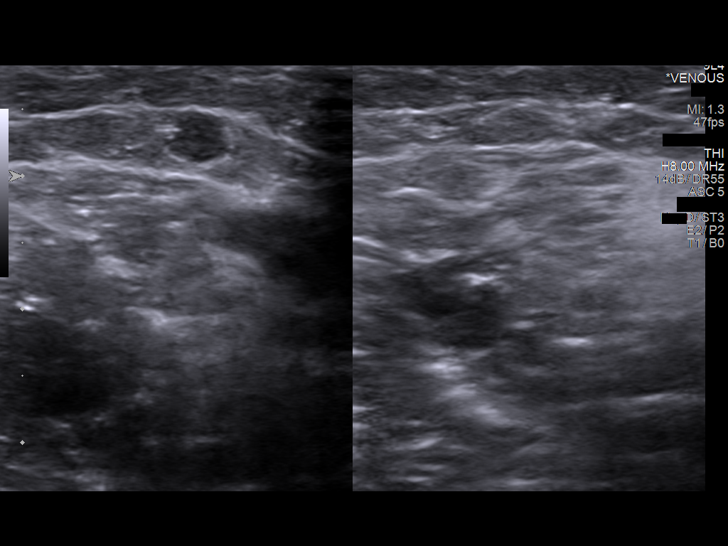
[im 47/52]
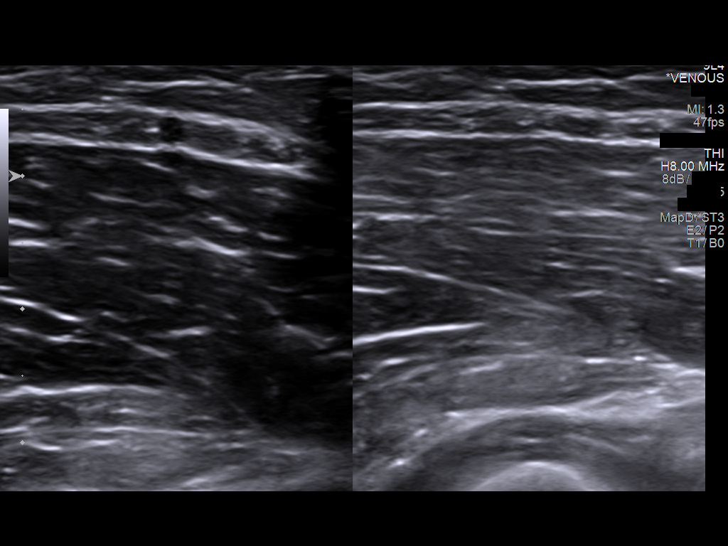
[im 52/52]
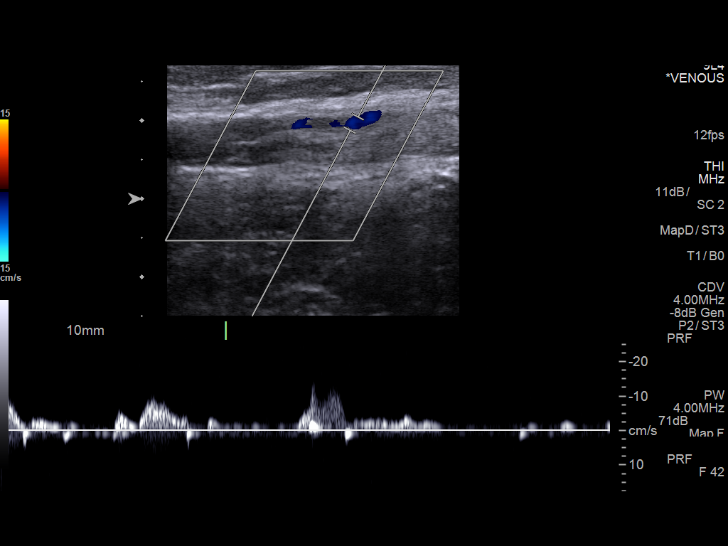

[13 of 24 positions shown; findings below may reference images not displayed]

FINDINGS: Contralateral Subclavian Vein: Respiratory phasicity is normal and
symmetric with the symptomatic side. No evidence of thrombus. Normal
compressibility.

Internal Jugular Vein: No evidence of thrombus. Normal
compressibility, respiratory phasicity and response to augmentation.

Subclavian Vein: No evidence of thrombus. Normal compressibility,
respiratory phasicity and response to augmentation.

Axillary Vein: No evidence of thrombus. Normal compressibility,
respiratory phasicity and response to augmentation.

Cephalic Vein: No evidence of thrombus. Normal compressibility,
respiratory phasicity and response to augmentation.

Basilic Vein: There is evidence occlusive thrombus in the basilic
vein of the upper arm above the elbow.

Brachial Veins: Nonocclusive thrombus is identified in the brachial
vein of the upper arm above the elbow.

Radial Veins: No evidence of thrombus. Normal compressibility,
respiratory phasicity and response to augmentation.

Ulnar Veins: No evidence of thrombus. Normal compressibility,
respiratory phasicity and response to augmentation.

Venous Reflux:  None visualized.

Other Findings:  None visualized.
IMPRESSION: Superficial thrombophlebitis of the right basilic vein and deep
venous nonocclusive thrombus in the right brachial vein above the
elbow.
